# Patient Record
Sex: Female | Born: 1965
Health system: Southern US, Community
[De-identification: ages and names within clinical notes are randomized; demographics above are authoritative.]

## PROBLEM LIST (undated history)

## (undated) DIAGNOSIS — E079 Disorder of thyroid, unspecified: Secondary | ICD-10-CM

## (undated) HISTORY — DX: Disorder of thyroid, unspecified: E07.9

## (undated) HISTORY — PX: BREAST EXCISIONAL BIOPSY: SUR124

## (undated) HISTORY — PX: TONSILLECTOMY: SUR1361

---

## 1999-04-06 ENCOUNTER — Encounter: Payer: Self-pay | Admitting: Cardiovascular Disease

## 1999-04-06 ENCOUNTER — Encounter: Payer: Self-pay | Admitting: Emergency Medicine

## 1999-04-06 ENCOUNTER — Inpatient Hospital Stay (HOSPITAL_COMMUNITY): Admission: EM | Admit: 1999-04-06 | Discharge: 1999-04-07 | Payer: Self-pay | Admitting: Emergency Medicine

## 1999-04-26 ENCOUNTER — Ambulatory Visit (HOSPITAL_COMMUNITY): Admission: RE | Admit: 1999-04-26 | Discharge: 1999-04-26 | Payer: Self-pay | Admitting: Gastroenterology

## 2000-01-14 ENCOUNTER — Other Ambulatory Visit: Admission: RE | Admit: 2000-01-14 | Discharge: 2000-01-14 | Payer: Self-pay | Admitting: Obstetrics & Gynecology

## 2000-02-21 ENCOUNTER — Encounter: Admission: RE | Admit: 2000-02-21 | Discharge: 2000-05-21 | Payer: Self-pay | Admitting: Obstetrics & Gynecology

## 2000-07-23 ENCOUNTER — Encounter (INDEPENDENT_AMBULATORY_CARE_PROVIDER_SITE_OTHER): Payer: Self-pay | Admitting: Specialist

## 2000-07-23 ENCOUNTER — Inpatient Hospital Stay (HOSPITAL_COMMUNITY): Admission: AD | Admit: 2000-07-23 | Discharge: 2000-07-25 | Payer: Self-pay | Admitting: Obstetrics & Gynecology

## 2000-07-29 ENCOUNTER — Inpatient Hospital Stay (HOSPITAL_COMMUNITY): Admission: AD | Admit: 2000-07-29 | Discharge: 2000-07-29 | Payer: Self-pay | Admitting: Obstetrics and Gynecology

## 2001-08-03 ENCOUNTER — Other Ambulatory Visit: Admission: RE | Admit: 2001-08-03 | Discharge: 2001-08-03 | Payer: Self-pay | Admitting: Obstetrics & Gynecology

## 2003-01-27 ENCOUNTER — Other Ambulatory Visit: Admission: RE | Admit: 2003-01-27 | Discharge: 2003-01-27 | Payer: Self-pay | Admitting: Obstetrics & Gynecology

## 2003-06-17 ENCOUNTER — Emergency Department (HOSPITAL_COMMUNITY): Admission: EM | Admit: 2003-06-17 | Discharge: 2003-06-17 | Payer: Self-pay | Admitting: Emergency Medicine

## 2004-06-28 ENCOUNTER — Other Ambulatory Visit: Admission: RE | Admit: 2004-06-28 | Discharge: 2004-06-28 | Payer: Self-pay | Admitting: Obstetrics & Gynecology

## 2005-10-17 ENCOUNTER — Other Ambulatory Visit: Admission: RE | Admit: 2005-10-17 | Discharge: 2005-10-17 | Payer: Self-pay | Admitting: Obstetrics & Gynecology

## 2005-10-28 ENCOUNTER — Emergency Department (HOSPITAL_COMMUNITY): Admission: EM | Admit: 2005-10-28 | Discharge: 2005-10-28 | Payer: Self-pay | Admitting: Emergency Medicine

## 2005-10-28 ENCOUNTER — Ambulatory Visit: Payer: Self-pay | Admitting: Internal Medicine

## 2005-11-19 ENCOUNTER — Encounter: Admission: RE | Admit: 2005-11-19 | Discharge: 2005-11-19 | Payer: Self-pay | Admitting: Obstetrics & Gynecology

## 2006-11-06 ENCOUNTER — Encounter: Admission: RE | Admit: 2006-11-06 | Discharge: 2006-11-06 | Payer: Self-pay | Admitting: Obstetrics & Gynecology

## 2008-01-28 ENCOUNTER — Ambulatory Visit: Payer: Self-pay | Admitting: Cardiology

## 2008-11-04 ENCOUNTER — Ambulatory Visit: Payer: Self-pay | Admitting: Gastroenterology

## 2008-11-04 DIAGNOSIS — K625 Hemorrhage of anus and rectum: Secondary | ICD-10-CM | POA: Insufficient documentation

## 2008-11-14 ENCOUNTER — Ambulatory Visit: Payer: Self-pay | Admitting: Gastroenterology

## 2009-12-07 ENCOUNTER — Encounter: Admission: RE | Admit: 2009-12-07 | Discharge: 2009-12-07 | Payer: Self-pay | Admitting: Obstetrics & Gynecology

## 2009-12-18 ENCOUNTER — Encounter: Admission: RE | Admit: 2009-12-18 | Discharge: 2009-12-18 | Payer: Self-pay | Admitting: Family Medicine

## 2010-09-30 ENCOUNTER — Encounter: Payer: Self-pay | Admitting: Family Medicine

## 2011-01-06 IMAGING — CT CT ABD-PELV W/ CM
2 of 5 series · 17 of 46 positions shown, 19 images · IV contrast (CONTRAST)
Comparison: None

CLINICAL DATA: Pelvic pain and nausea.

CT ABDOMEN AND PELVIS WITH CONTRAST
TECHNIQUE: Multidetector CT imaging of the abdomen and pelvis was
performed following the standard protocol during bolus
administration of intravenous contrast.
Contrast: 100 ml [id number]

[Series 2: portal · axial · portal-venous · 0.68mm/px · z∈[+536,+911]mm · 14 of 85 slices shown, 16 images]
[im 5/85  soft-tissue]
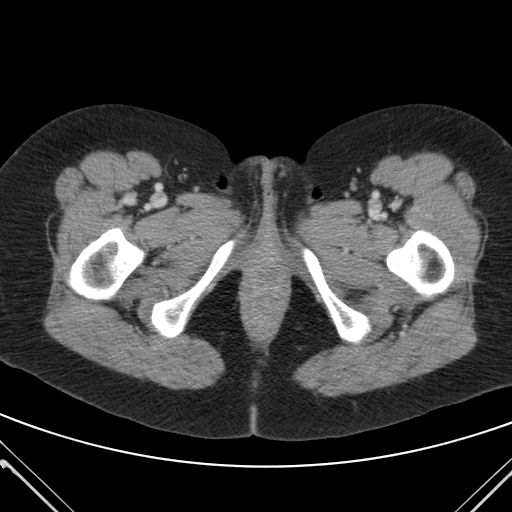
[im 5/85  bone]
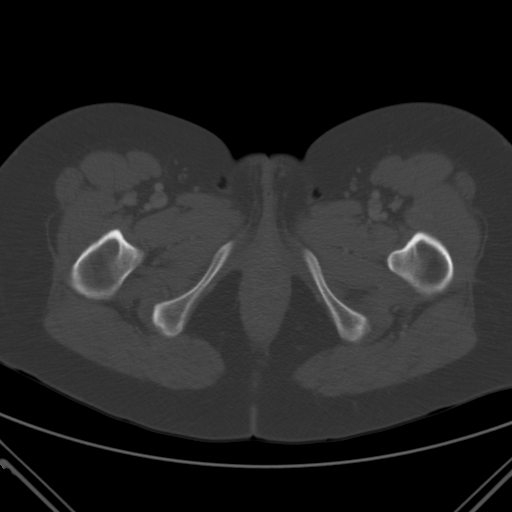
[im 10/85  soft-tissue]
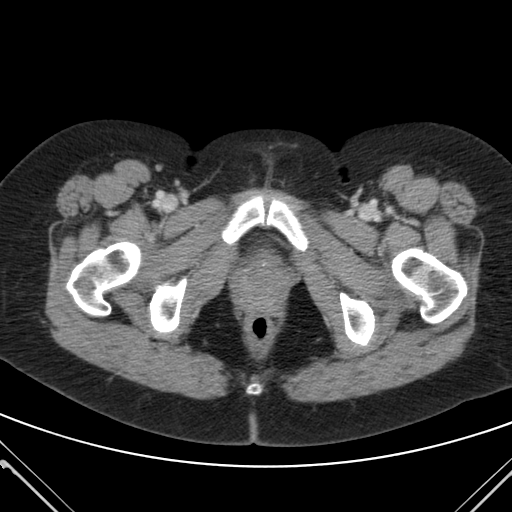
[im 19/85  soft-tissue]
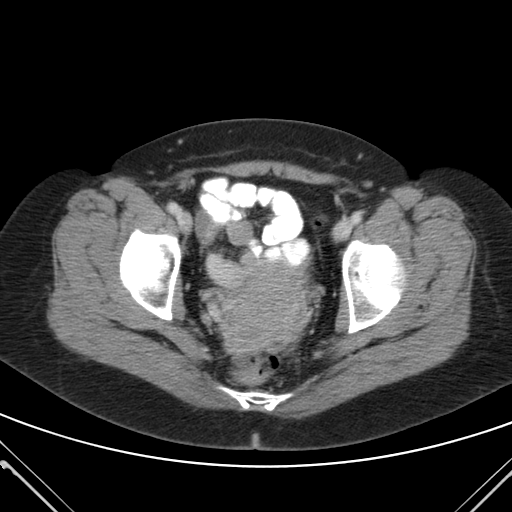
[im 24/85  soft-tissue]
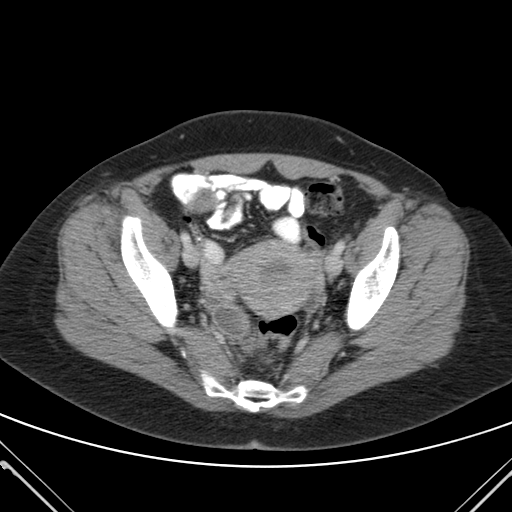
[im 29/85  soft-tissue]
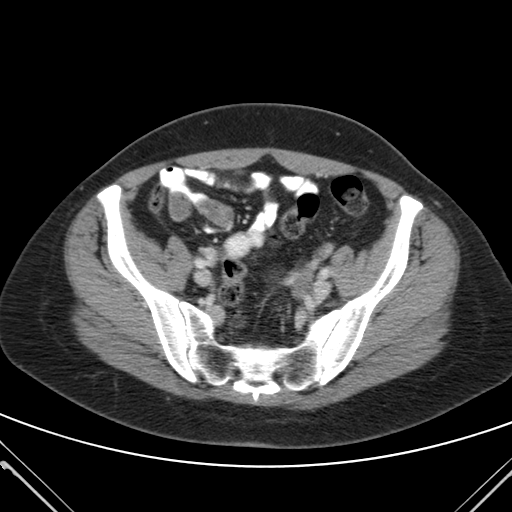
[im 33/85  soft-tissue]
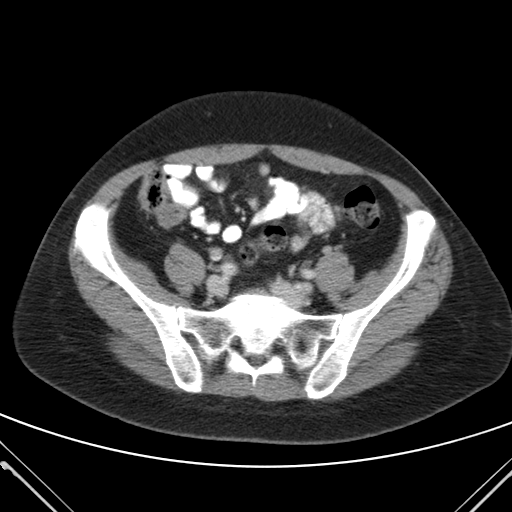
[im 38/85  soft-tissue]
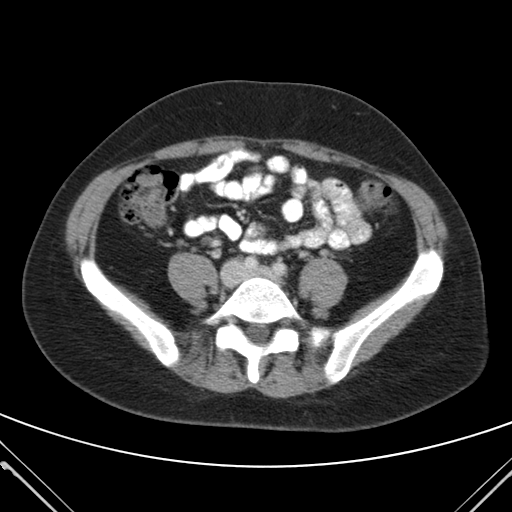
[im 47/85  soft-tissue]
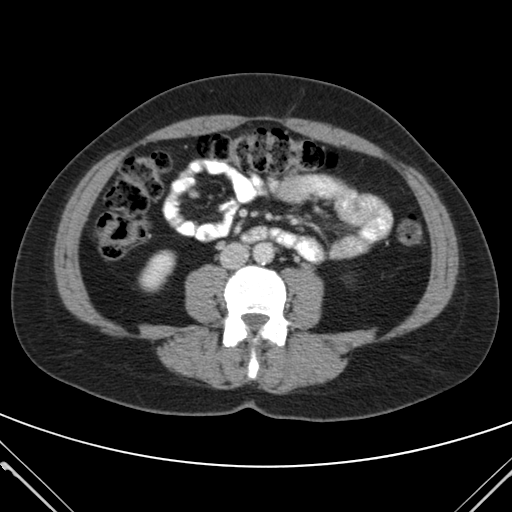
[im 52/85  soft-tissue]
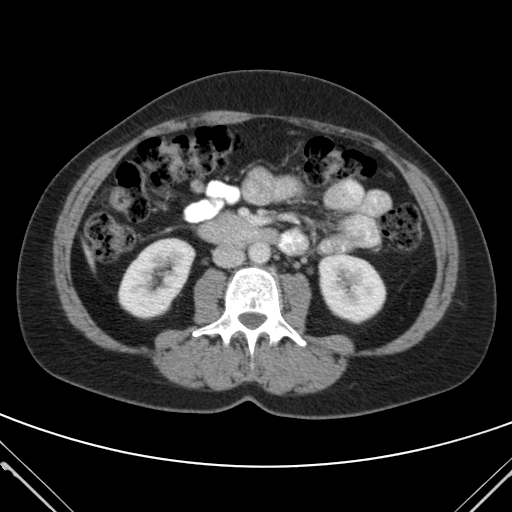
[im 52/85  bone]
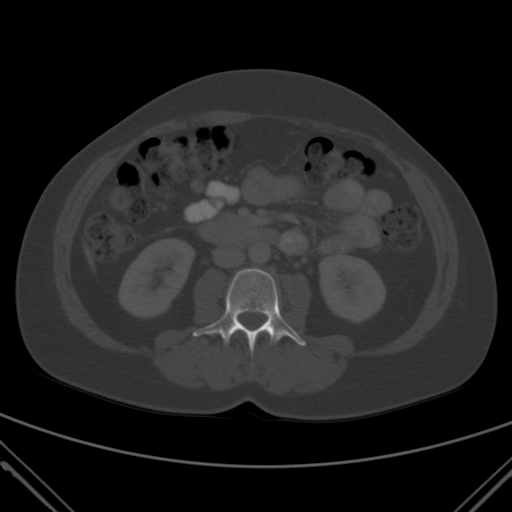
[im 57/85  soft-tissue]
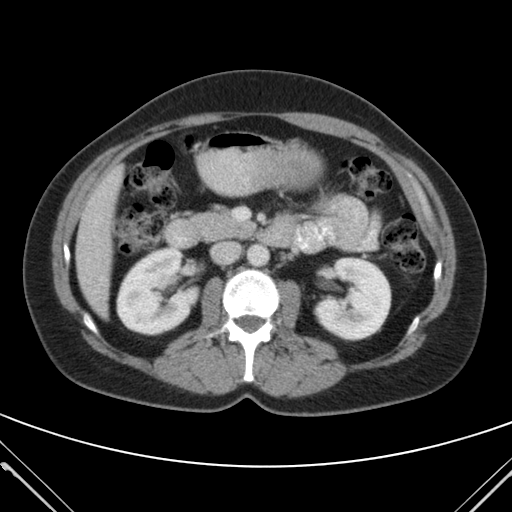
[im 61/85  soft-tissue]
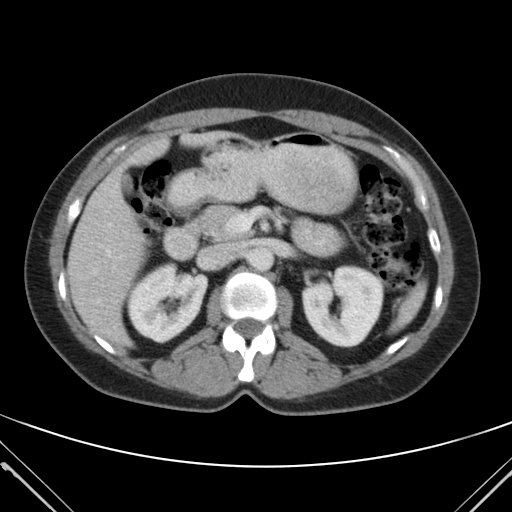
[im 66/85  soft-tissue]
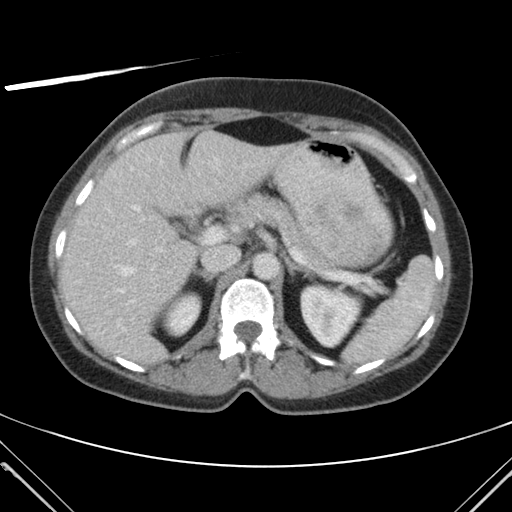
[im 75/85  soft-tissue]
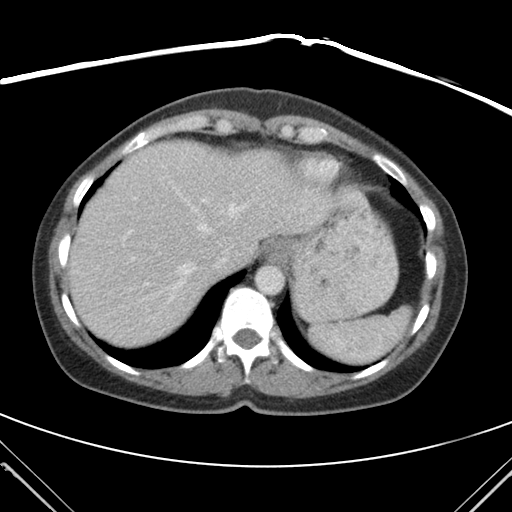
[im 80/85  soft-tissue]
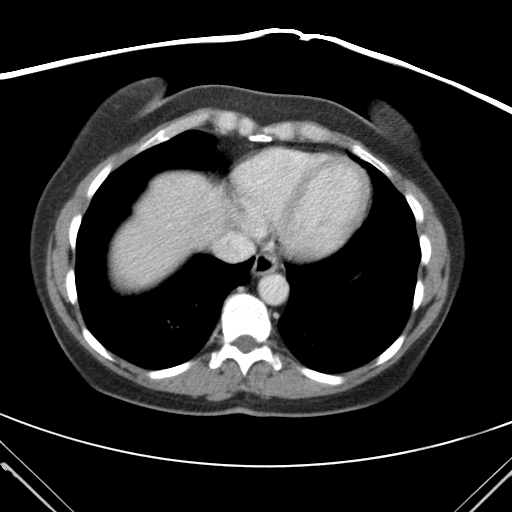

[cor · coronal · 0.82mm/px · 3 of 75 slices shown]
[im 25/75  soft-tissue]
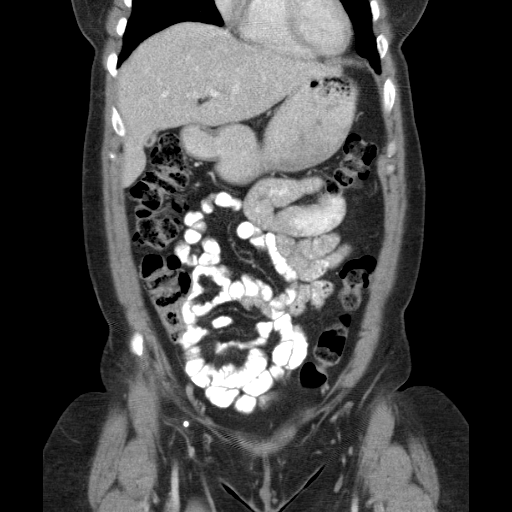
[im 33/75  soft-tissue]
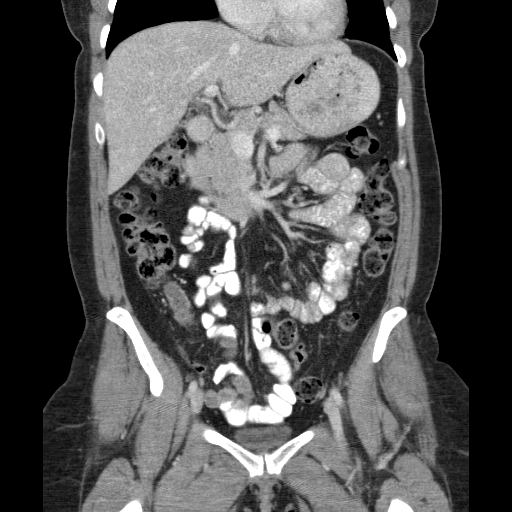
[im 42/75  soft-tissue]
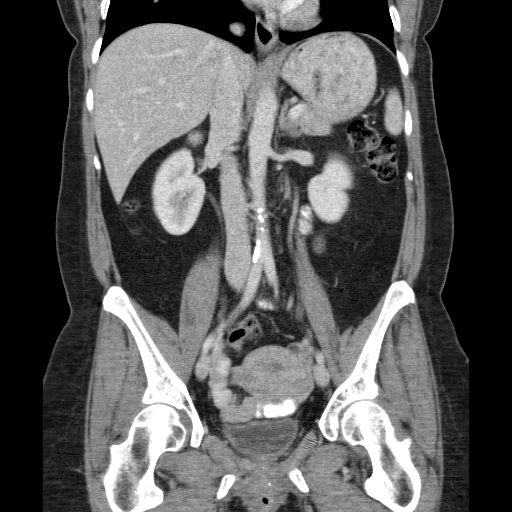

[17 of 46 positions shown; findings below may reference images not displayed]

FINDINGS: Lung bases are clear.  No pleural or pericardial fluid.
The liver has a normal appearance without focal lesions or biliary
ductal dilatation.  No calcified gallstones.  The spleen is normal.
The pancreas is normal.  The adrenal glands are normal.  The
kidneys are normal.  The aorta shows minimal atherosclerosis but no
aneurysm.  The IVC is normal.  No retroperitoneal mass or
adenopathy.  No bowel pathology seen within the abdominal portion
of the scan.  No free fluid or air in that region.

In the pelvis, the appendix is well seen and appears normal.  The
uterus appears normal.  The left adnexal region appears normal.  In
the right adnexal region, there are some areas of rounded fluid
density material.  These could possibly represent ovarian cysts.
The differential diagnosis does include tubo-ovarian abscess,
hydrosalpinx and endometriomas.
IMPRESSION: No definitely pathologic finding.  Areas of low density on the
order of 2 cm in size in the right adnexal region that could
represent  functional ovarian cysts.  However, the differential
diagnosis does include tubo-ovarian abscess, hydrosalpinx and
endometrioma.

## 2011-01-22 NOTE — Assessment & Plan Note (Signed)
[facility]                            CARDIOLOGY OFFICE NOTE   Leah Torres, Leah Torres                       MRN:          [id number]  DATE:01/28/2008                            DOB:          08-11-1966    REASON FOR PRESENTATION:  Evaluate the patient with palpitations.   HISTORY OF PRESENT ILLNESS:  The patient is a pleasant 45 year old with  past history of chest discomfort.  She had a cardiac catheterization in  2007 that demonstrated an EF of 65% and no coronary disease.  She had  had chest pain some years before that and had a stress test, which was  normal.  She has not had any chest discomfort since that time.  She did  have palpitations.  This was early April.  She was just developing Strep  at that time.  She felt her heart skipping and flipping and flopping.  It would seem to stop and then start back again.  It was happening  several times in 1 day.  It persisted for few days.  She had another  episode, less severe some days after that.  However, in the last few  weeks, she has had none of this.  She has had no presyncope or syncope.  She cannot bring these episodes on.  She now feels well.  She has not  been having any chest discomfort.  She has been having no shortness of  breath, PND, or orthopnea.  She is active, doing her activities of daily  living, and occasionally walking for exercise, but does not have a  regular exercise regimen.   Of note, I did review her labs with her that demonstrate she has an LDL  of 148 and an HDL of 62.   PAST MEDICAL HISTORY:  1. Hypothyroidism.  2. Dyslipidemia as described.   PAST SURGICAL HISTORY:  1. Tubal ligation.  2. Tonsillectomy.   ALLERGIES:  None.   MEDICATIONS:  1. Synthroid 150 mcg daily.  2. Advil.   SOCIAL HISTORY:  The patient is a Architectural technologist.  She is married  and has 4 children.  She quit smoking in 1992.  She does not drink  alcohol.   FAMILY HISTORY:  Contributory  for father having longstanding vascular  disease and dying at the age 16 of stomach cancer.   REVIEW OF SYSTEMS:  As stated in the HPI and negative for all other  systems.   PHYSICAL EXAMINATION:  The patient is well appearing and in no distress.  Blood pressure 121/85, heart rate 81 and regular.  HEENT:  Eyes unremarkable; pupils equal, round, and reactive to light;  fundi not visualized; oral mucosa moist.  NECK:  No jugular venous distention at 45 degrees, carotid upstroke  brisk and symmetric, no bruits, no thyromegaly.  LYMPHATICS:  No cervical, axillary, or inguinal adenopathy.  LUNGS:  Clear to auscultation bilaterally.  BACK:  No costovertebral angle tenderness.  CHEST:  Unremarkable.  HEART:  PMI not displaced or sustained, S1 and S2 within normal limits,  no S3, no S4, no clicks, no rubs, no murmurs.  ABDOMEN:  Flat, positive bowel sounds, normal in frequency and pitch, no  bruits, no rebound, no guarding, no midline pulsatile mass, no  hepatomegaly, no splenomegaly.  SKIN:  No rashes, no nodules.  EXTREMITIES:  2+ pulses throughout, no edema, no cyanosis or clubbing.  NEUROLOGICAL:  Oriented to person, place, and time.  Cranial nerves II  through XII are grossly intact.  Motor grossly intact.   EKG:  Sinus rhythm, rate 85, axis within normal limits, intervals within  normal limits, no acute ST-T wave changes.   ASSESSMENT AND PLAN:  1. Premature atrial contractions.  The patient had premature atrial      contractions on an EKG at Dr. Kathi Der office.  She has      palpitations, but these are no longer bothering her.  She had a      normal catheterization in 2007, and otherwise, normal physical      exam.  She has her thyroid followed regularly and did recently had      other labs to include normal electrolytes.  At this point, I would      not suggest further cardiovascular testing.  If she has more      symptomatic palpitations going forward, then I would promptly       empirically treat her with a beta-blocker or a calcium channel      blocker.  She will let me know if this occurs.  2. Dyslipidemia.  We spent a long time discussing this.  Given her      family history, I would have a low threshold for statins.  However,      she wants to avoid this and wants to very much try diet and      exercise.  We discussed goals of therapy.  She will follow up with      Dr. Christell Constant.  3. Weight.  The patient is slightly overweight.  She just bought the      [address], which is my recommended diet, and I encouraged      this with exercise.  4. Hypothyroidism.  She is on thyroid replacement and is due to have      this checked soon.  This is particularly important in light of her      palpitations.  5. Chest pain.  She is no longer having any chest discomfort, and no      further cardiovascular testing is suggested.  6. Follow up will be as needed.    Rollene Rotunda, MD, [facility]  Electronically Signed   JH/MedQ  DD: 01/28/2008  DT: 01/29/2008  Job #: [id number]   cc:   Ernestina Penna, M.D.

## 2011-01-25 NOTE — Discharge Summary (Signed)
[facility]  Patient:    Leah Torres, Leah Torres                       MRN: [id number] Adm. Date:  [id number] Disc. Date: [id number] Attending:  Marcelle Overlie Dictator:   Leilani Able, P.A.                           Discharge Summary  FINAL DIAGNOSES:              1. Vacuum assisted vaginal delivery of a female                                  infant with Apgars of 8 and 9.                               2. Desire postpartum sterilization.  PROCEDURE:                    Pomeroy tubal sterilization.  SURGEON:                      Gerrit Friends. Aldona Bar, M.D.  HISTORY OF PRESENT ILLNESS:   This 45 year old G4, P3 presents at [redacted] weeks gestation for induction secondary to concern of macrosomia.  The patients cervix was already 3 cm dilated, 50% effaced, at a -2 station.  HOSPITAL COURSE:              AROM was performed.  IUPCs were placed. The patient dilated to complete. She had a vacuum assisted vaginal delivery of a 7 pound 12 ounce female infant with Apgars of 8 and 9.  The delivery went without complications and the delivery was performed by Dr. Annamaria Helling. The patient also expressed her desires for a tubal sterilization procedure. She was taken to the operating room on July 23, 2000, by Dr. Aldona Bar as well, where a postpartum tubal ligation was performed using the Pomeroy method.  The procedure went without complications.  The patients postpartum course was benign without significant fevers.  She was thought ready for discharge on postpartum day #2.  DISCHARGE INSTRUCTIONS:       She was sent home on a regular diet, told to decrease activities.  She was given Tylox one to two every four hours as needed for pain.  She was told she could use over-the-counter pain medicines and was told to follow up in the office in four weeks. DD:  08/20/00 TD:  08/20/00 Job: [id number]

## 2011-01-25 NOTE — H&P (Signed)
NAME:  Leah Torres, Leah Torres NO.:  [id number]   MEDICAL RECORD NO.:  [id number]          PATIENT TYPE:  INP   LOCATION:  1825                         FACILITY:  [facility]   PHYSICIAN:  Pricilla Riffle, M.D.    DATE OF BIRTH:  06-Jul-1966   DATE OF ADMISSION:  10/28/2005  DATE OF DISCHARGE:                                HISTORY & PHYSICAL   PRIMARY CARDIOLOGIST:  Dr. Dietrich Pates (new)   REASON FOR ADMISSION:  Ms. Gerdts is a 45 year old female with no known  history of coronary artery disease, but with cardiac risk factors notable  for history of hypercholesterolemia (untreated), history of tobacco smoking,  and family history of coronary artery disease.   Patient presents to the emergency room with recurrent chest pain over these  past three days.  However, she reports having had chest pain previously in  September 2006 while vacationing in [place].  At that time her father  required hospitalization and she experienced an episode of chest pain while  walking in the hospital hallway.  She was tended to by a nurse, referred to  the emergency room, and subsequently discharged with plans to return for a  scheduled exercise stress test.  A stress test was done and she was able to  exercise for 10 minutes in standard Bruce protocol achieving 87% PMHR.  She  had no chest pain during the test and perfusion images revealed no evidence  of ischemia/infarction; EF 67% with no obvious WMAs.   Patient then reports having had one recurrent episode of chest pain after  they returned from [facility].  This was in the setting of trying to help  her father who was severely debilitated and helping him to ambulate.  She  experienced chest pain with associated lightheadedness, but did not seek any  medical attention.  The symptoms resolved spontaneously.   This past Saturday night while sitting and watching a movie with her husband  she had mid sternal chest tightness which radiated  down the left arm and was  associated with some right neck discomfort and into the jaw as well.  There  was no associated dyspnea, diaphoresis, or nausea/vomiting.  She did not  take anything for this nor leave her seated position.  The symptoms resolved  spontaneously after approximately four hours.  She then had some mild  recurrent episodes throughout the day yesterday as well as a few this  morning.  Again, however, none of these were associated with any strenuous  activity or exercise.  These episodes occur at rest, resolve after a few  minutes, and have been less intense and of shorter duration than the one she  experienced Saturday night.   Admission electrocardiogram shows normal sinus rhythm with no obvious  ischemic changes.   ALLERGIES:  No known drug allergies.   HOME MEDICATIONS:  None.   PAST MEDICAL HISTORY:  1.  History of hypercholesterolemia (reportedly approximately 260 with HDL      in the 60s).  2.  Status post tubal ligation.   SOCIAL HISTORY:  [place]  They have four children.  She is unemployed.  She has not smoked tobacco in  approximately 15 years, but has a prior approximate 8-pack-year history of  smoking.  Denies alcohol use.   FAMILY HISTORY:  Father deceased age 60, history of congestive heart failure  and prior MIs in his 3s, bilateral carotid endarterectomy and history of  AAA.  Mother age 16 with no known heart disease.  Patient also reports a  maternal uncle age 35 recently diagnosed with CAD/status post stenting.   REVIEW OF SYSTEMS:  As noted per HPI.  Denies exertional chest pain or any  recent exertional dyspnea, orthopnea, or paroxysmal nocturnal dyspnea.  Denies any symptoms of reflux disease or a history of peptic ulcer disease.  Denies any obvious bleeding.  Otherwise, as noted per HPI.  Remaining  systems negative.   PHYSICAL EXAMINATION:  VITAL SIGNS:  Blood pressure 132/83 with a  pulse of  92 and saturations 98% on room air.  GENERAL:  45 year old female in no apparent distress.  HEENT:  Normocephalic, atraumatic.  NECK:  Visible carotid pulses without bruits.  LUNGS:  Clear to auscultation all fields.  HEART:  Regular rate and rhythm (S1, S2).  No murmurs, rubs, or gallops.  ABDOMEN:  Soft, nontender with intact bowel sounds and no bruits.  EXTREMITIES:  Palpable bilateral femoral pulses without bruits.  Intact  distal pulses without significant edema.  NEUROLOGIC:  No focal deficit.   Admission chest x-ray:  Pending.  Admission electrocardiogram:  Normal sinus  rhythm at 94 BPM with normal axis, no ischemic changes.   LABORATORY DATA:  Hemoglobin 13.3, hematocrit 38.6, WBC 4.3, platelets 322.  Sodium 134, potassium 3.9, BUN 9, creatinine 0.6, glucose 93.  Total protein  6.3, albumin 3.  INR 0.9.  Liver enzymes normal.  Cardiac enzymes (POC):  MB  1.3, troponin I less than 0.05.   IMPRESSION:  1.  Recurrent angina pectoris.      1.  Non-exertional/atypical features.      2.  Normal adequate exercise stress Cardiolite; ejection fraction 67%          September 2006 [facility]).  2.  Multiple cardiac risk factors.      1.  History of hypercholesterolemia.      2.  Family history coronary artery disease.      3.  History of tobacco.  3.  Anxiety.   PLAN:  Although the patient presents with features which are atypical for  ischemic heart disease, she has had recurrent chest pain and does have  several cardiac risk factors.  She also had prior history of chest pain with  a normal exercise stress test in September 2006.  Therefore, recommendation  is to proceed with definitive exclusion of any significant coronary artery  disease with a diagnostic coronary angiogram.  The patient is agreeable with  this plan and is willing to proceed.  Risks/benefits have been discussed.  Patient has been treated with aspirin and we will give her a dose of  Lopressor 25  while in the emergency room.  We will also start her on a  proton-pump inhibitor.  Intravenous heparin will be deferred given that she  is currently asymptomatic, has initial cardiac enzymes which are negative,  and a benign electrocardiogram.   In addition to cycling cardiac markers we will also check a fasting lipid  profile in the morning as well as a TSH level.      Gene  Serpe, P.A. [facility]    ______________________________  Pricilla Riffle, M.D.    GS/MEDQ  D:  10/28/2005  T:  10/28/2005  Job:  [id number]   cc:   Chari Manning, M.D.

## 2011-01-25 NOTE — Op Note (Signed)
[facility]  Patient:    Leah Torres, Leah Torres                       MRN: [id number] Proc. Date: 07/23/00 Adm. Date:  [id number] Attending:  Mickle Mallory                           Operative Report  PREOPERATIVE DIAGNOSIS:       Postpartum desire for permanent elective                               sterilization.  POSTOPERATIVE DIAGNOSIS:      Postpartum desire for permanent elective                               sterilization.  PATHOLOGY:                    Pending on segment of each fallopian tube.  OPERATION:                    Pomeroy tubal sterilization, postpartum.  SURGEON:                      Gerrit Friends. Aldona Bar, M.D.  ANESTHESIA:                   Epidural.  INDICATIONS:                  This 45 year old gravida 3, now para 4, delivered earlier today and is desirous of a permanent elective sterilization procedure.  She is aware that such procedure is meant to be 100% permanent, but unfortunately is not 100% perfect - subsequent pregnancy can occur. According to her wishes, she is now being taken to the operating room for a sterilization procedure utilizing the epidural that she had placed during her labor.  DESCRIPTION OF PROCEDURE:     The patient was taken to the operating room where her epidural was augmented and after good anesthetic levels were documented, she was prepped and draped in the usual fashion having been placed in the supine position.  The uterus was at the umbilicus.  With minimal difficulty, a 2 cm subumbilical transverse skin incision was made and dissected down to and through the fascia and peritoneum with ease.  The fundus of the uterus felt and looked normal.  With minimal difficulty, the right fallopian tube was identified, traced out to the fimbriated end for positive identification, and then in the mid portion of the right fallopian tube a knuckle was elevated and a free tie of #1 plain catgut suture was tied  about the knuckle and the knuckle was removed and sent to pathology.  Hemostasis was adequate.  A similar procedure was carried out on the left fallopian tube.  At this time, with good  hemostasis and a segment of each fallopian tube resected, closure of the abdomen was begun after all counts were known to be correct and no foreign bodies were noted to be remaining in the abdominal cavity.  The abdominal peritoneum was closed with 0 Vicryl in a running fashion and fascia was closed with 0 Vicryl in an interrupted fashion. Hemostasis was adequate and the skin and subcu tissue were closed with running subcuticular sutures of 3-0 Vicryl.  A Band-Aid was applied and the patient at this time was transported to the recovery room in satisfactory condition after tolerating the procedure well.  Estimated blood loss was negligible.  All counts were correct x 2.  Pathologic specimen consisted of a segment of each fallopian tube.  The patient will be transferred to her routine postpartum room after leaving the recovery room and will resume routine postpartum orders with her epidural and IV eventually removed. DD:  07/23/00 TD:  07/23/00 Job: [id number]

## 2011-01-25 NOTE — Cardiovascular Report (Signed)
NAMESHEMECA, LUKASIK NO.:  [id number]   MEDICAL RECORD NO.:  [id number]          PATIENT TYPE:  INP   LOCATION:  1825                         FACILITY:  [facility]   PHYSICIAN:  Salvadore Farber, M.D. LHCDATE OF BIRTH:  Mar 14, 1966   DATE OF PROCEDURE:  10/28/2005  DATE OF DISCHARGE:  10/28/2005                              CARDIAC CATHETERIZATION   PROCEDURE:  Left heart catheterization, left ventriculography, coronary  angiography, Starclose closure of the right common femoral arteriotomy site.   INDICATIONS:  Ms. Desouza is a 45 year old woman with family history of  premature atherosclerotic disease, her father having developed coronary  disease in his 12s. She has had episodes of substernal chest discomfort  occurring primarily at rest for several months. She had an exercise  tolerance test performed in the fall which demonstrated no evidence of  ischemia per report. Over the past couple of weeks, she has had multiple  episodes including a prolonged one two days prior. She spoke with Dr.  Rennie Plowman today who recommended that she proceed to hospital for urgent  evaluation. Here, she is painfree with a normal electrocardiogram. After  discussion of testing modalities for exclusion of coronary disease as an  etiology of her chest discomfort, the patient and Dr. Tenny Craw arrived at the  decision to proceed to cardiac catheterization for definitive exclusion.   PROCEDURAL TECHNIQUE:  Informed consent was obtained. Under 1% lidocaine  local anesthesia, a 5-French sheath was placed in the right common femoral  artery using the modified Seldinger technique. Diagnostic angiography and  ventriculography were performed using JL-4, JR-4 and pigtail catheters. The  arteriotomy was then closed using a Starclose device. Complete hemostasis  was obtained. The patient was then transferred to the holding room in stable  condition having tolerated the procedure well.   COMPLICATIONS:  None.   FINDINGS:  1.  LV: 120/2/10. EF 65% without regional wall motion abnormality.  2.  No aortic stenosis or mitral regurgitation.  3.  Left main: Angiographically normal.  4.  LAD: Moderate-sized vessel giving rise to two diagonal branches. The      first is moderate-sized and the second is small. It is angiographically      normal.  5.  Circumflex: Moderate-sized vessel giving rise to a single large      branching obtuse marginal. It is angiographically normal.  6.  RCA: Moderate-sized dominant vessel with a downward takeoff. It is      angiographically normal.   IMPRESSION/RECOMMENDATIONS:  The patient has angiographically normal  coronary arteries with normal left ventricular size and systolic function.  There is no aortic stenosis or mitral regurgitation. Based on these  findings, I suspect a noncardiac etiology to her chest discomfort. I have  asked her to follow up with her primary care physician for further  evaluation.      Salvadore Farber, M.D. [facility]  Electronically Signed     WED/MEDQ  D:  10/28/2005  T:  10/28/2005  Job:  [id number]   cc:   Pricilla Riffle, M.D.  [address]  [address]  [address]  [place] [address]   [place], M.D.  [place], [place]

## 2012-07-13 ENCOUNTER — Other Ambulatory Visit: Payer: Self-pay | Admitting: Obstetrics & Gynecology

## 2012-07-13 DIAGNOSIS — N63 Unspecified lump in unspecified breast: Secondary | ICD-10-CM

## 2012-07-17 ENCOUNTER — Ambulatory Visit
Admission: RE | Admit: 2012-07-17 | Discharge: 2012-07-17 | Disposition: A | Discharge status: Home / Self Care | Payer: BC Managed Care – PPO | Source: Ambulatory Visit | Attending: Obstetrics & Gynecology | Admitting: Obstetrics & Gynecology

## 2012-07-17 DIAGNOSIS — N63 Unspecified lump in unspecified breast: Secondary | ICD-10-CM

## 2013-08-09 ENCOUNTER — Encounter: Payer: Self-pay | Admitting: Nurse Practitioner

## 2013-08-09 ENCOUNTER — Ambulatory Visit (INDEPENDENT_AMBULATORY_CARE_PROVIDER_SITE_OTHER): Payer: BC Managed Care – PPO | Admitting: Nurse Practitioner

## 2013-08-09 ENCOUNTER — Encounter (INDEPENDENT_AMBULATORY_CARE_PROVIDER_SITE_OTHER): Payer: Self-pay

## 2013-08-09 VITALS — BP 124/81 | HR 93 | Temp 97.5°F | Ht 62.0 in | Wt 148.0 lb

## 2013-08-09 DIAGNOSIS — F411 Generalized anxiety disorder: Secondary | ICD-10-CM | POA: Insufficient documentation

## 2013-08-09 DIAGNOSIS — G4700 Insomnia, unspecified: Secondary | ICD-10-CM

## 2013-08-09 MED ORDER — ESCITALOPRAM OXALATE 10 MG PO TABS: 10.0000 mg | ORAL_TABLET | Freq: Every day | ORAL | Status: DC

## 2013-08-09 MED ORDER — ZOLPIDEM TARTRATE 10 MG PO TABS: 10.0000 mg | ORAL_TABLET | Freq: Every evening | ORAL | Status: DC | PRN

## 2013-08-09 NOTE — Progress Notes (Signed)
   Subjective:    Patient ID: Leah Torres, female    DOB: 07/10/1966, 47 y.o.   MRN: [id number]  HPI Patient in today c/o trouble sleeping. 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  * patient says that she has become very anxious and would like to try something for anxiety- her new job is stressing her out.   Review of Systems  Respiratory: Negative.   Cardiovascular: Negative.   Gastrointestinal: Negative.   Psychiatric/Behavioral: Positive for sleep disturbance and decreased concentration. Negative for suicidal ideas, behavioral problems, confusion and agitation. The patient is nervous/anxious.   All other systems reviewed and are negative.       Objective:   Physical Exam  Constitutional: She is oriented to person, place, and time. She appears well-developed and well-nourished.  Cardiovascular: Normal rate, regular rhythm and normal heart sounds.   Pulmonary/Chest: Effort normal and breath sounds normal.  Neurological: She is alert and oriented to person, place, and time.  Skin: Skin is warm.  Psychiatric: She has a normal mood and affect. Her behavior is normal. Judgment and thought content normal.    BP 124/81  Pulse 93  Temp(Src) 97.5 F (36.4 C) (Oral)  Ht 5\' 2"  (1.575 m)  Wt 148 lb (67.132 kg)  BMI 27.06 kg/m2       Assessment & Plan:  1. GAD (generalized anxiety disorder) Stress management - escitalopram (LEXAPRO) 10 MG tablet; Take 1 tablet (10 mg total) by mouth daily.  Dispense: 30 tablet; Refill: 3  2. Insomnia Bedtime ritual Do not eat heavy meal 2 hours prior to bedtime - zolpidem (AMBIEN) 10 MG tablet; Take 1 tablet (10 mg total) by mouth at bedtime as needed for sleep.  Dispense: 30 tablet; Refill: 1   Mary-Margaret Daphine Deutscher, FNP

## 2013-08-09 NOTE — Patient Instructions (Signed)
Stress Management Stress is a state of physical or mental tension that often results from changes in your life or normal routine. Some common causes of stress are:  Death of a loved one.  Injuries or severe illnesses.  Getting fired or changing jobs.  Moving into a new home. Other causes may be:  Sexual problems.  Business or financial losses.  Taking on a large debt.  Regular conflict with someone at home or at work.  Constant tiredness from lack of sleep. It is not just bad things that are stressful. It may be stressful to:  Win the lottery.  Get married.  Buy a new car. The amount of stress that can be easily tolerated varies from person to person. Changes generally cause stress, regardless of the types of change. Too much stress can affect your health. It may lead to physical or emotional problems. Too little stress (boredom) may also become stressful. SUGGESTIONS TO REDUCE STRESS:  Talk things over with your family and friends. It often is helpful to share your concerns and worries. If you feel your problem is serious, you may want to get help from a professional counselor.  Consider your problems one at a time instead of lumping them all together. Trying to take care of everything at once may seem impossible. List all the things you need to do and then start with the most important one. Set a goal to accomplish 2 or 3 things each day. If you expect to do too many in a single day you will naturally fail, causing you to feel even more stressed.  Do not use alcohol or drugs to relieve stress. Although you may feel better for a short time, they do not remove the problems that caused the stress. They can also be habit forming.  Exercise regularly - at least 3 times per week. Physical exercise can help to relieve that "uptight" feeling and will relax you.  The shortest distance between despair and hope is often a good night's sleep.  Go to bed and get up on time allowing  yourself time for appointments without being rushed.  Take a short "time-out" period from any stressful situation that occurs during the day. Close your eyes and take some deep breaths. Starting with the muscles in your face, tense them, hold it for a few seconds, then relax. Repeat this with the muscles in your neck, shoulders, hand, stomach, back and legs.  Take good care of yourself. Eat a balanced diet and get plenty of rest.  Schedule time for having fun. Take a break from your daily routine to relax. HOME CARE INSTRUCTIONS   Call if you feel overwhelmed by your problems and feel you can no longer manage them on your own.  Return immediately if you feel like hurting yourself or someone else. Document Released: 02/19/2001 Document Revised: 11/18/2011 Document Reviewed: 04/20/2013 Exit[employer] Patient Information 2014 [employer].  

## 2013-08-17 ENCOUNTER — Encounter: Payer: Self-pay | Admitting: Nurse Practitioner

## 2013-08-17 ENCOUNTER — Ambulatory Visit (INDEPENDENT_AMBULATORY_CARE_PROVIDER_SITE_OTHER): Payer: BC Managed Care – PPO | Admitting: Nurse Practitioner

## 2013-08-17 ENCOUNTER — Telehealth: Payer: Self-pay | Admitting: Nurse Practitioner

## 2013-08-17 VITALS — BP 119/76 | HR 74 | Temp 97.6°F | Ht 62.0 in | Wt 144.5 lb

## 2013-08-17 DIAGNOSIS — J019 Acute sinusitis, unspecified: Secondary | ICD-10-CM

## 2013-08-17 MED ORDER — AZITHROMYCIN 250 MG PO TABS: ORAL_TABLET | ORAL | Status: DC

## 2013-08-17 NOTE — Telephone Encounter (Signed)
Appt given for today with MMM

## 2013-08-17 NOTE — Progress Notes (Signed)
   Subjective:    Patient ID: Leah Torres, female    DOB: [birth year], 47 y.o.   MRN: [id number]  HPI Patient in c/o congestion and headache- started last Thursday- OTC benadryl- no better- fatigue.    Review of Systems  Constitutional: Positive for appetite change (decreased) and fatigue. Negative for fever and chills.  HENT: Positive for congestion, postnasal drip, rhinorrhea and sinus pressure. Negative for ear pain.   Respiratory: Negative for cough, choking and shortness of breath.   Cardiovascular: Negative.   Gastrointestinal: Negative.        Objective:   Physical Exam  Constitutional: She appears well-developed and well-nourished.  HENT:  Right Ear: Hearing, tympanic membrane, external ear and ear canal normal.  Left Ear: Hearing, tympanic membrane, external ear and ear canal normal.  Nose: Right sinus exhibits maxillary sinus tenderness and frontal sinus tenderness. Left sinus exhibits maxillary sinus tenderness and frontal sinus tenderness.  Mouth/Throat: Uvula is midline, oropharynx is clear and moist and mucous membranes are normal.  Eyes: EOM are normal. Pupils are equal, round, and reactive to light.  Neck: Normal range of motion. Neck supple.  Cardiovascular: Normal rate, regular rhythm and normal heart sounds.   Pulmonary/Chest: Effort normal and breath sounds normal.  Lymphadenopathy:    She has no cervical adenopathy.    BP 119/76  Pulse 74  Temp(Src) 97.6 F (36.4 C) (Oral)  Ht 5\' 2"  (1.575 m)  Wt 144 lb 8 oz (65.545 kg)  BMI 26.42 kg/m2  LMP 07/27/2013       Assessment & Plan:   1. Acute rhinosinusitis    Meds ordered this encounter  Medications  . azithromycin (ZITHROMAX Z-PAK) 250 MG tablet    Sig: As directed    Dispense:  6 each    Refill:  0    Order Specific Question:  Supervising Provider    Answer:  Ernestina Penna [[id number]]   1. Take meds as prescribed 2. Use a cool mist humidifier especially during the winter months and when heat  has  been humid. 3. Use saline nose sprays frequently 4. Saline irrigations of the nose can be very helpful if done frequently.  * 4X daily for 1 week*  * Use of a nettie pot can be helpful with this. Follow directions with this* 5. Drink plenty of fluids 6. Keep thermostat turn down low 7.For any cough or congestion  Use plain Mucinex- regular strength or max strength is fine   * Children- consult with Pharmacist for dosing 8. For fever or aces or pains- take tylenol or ibuprofen appropriate for age and weight.  * for fevers greater than 101 orally you may alternate ibuprofen and tylenol every  3 hours.   Mary-Margaret Daphine Deutscher, FNP

## 2013-08-17 NOTE — Patient Instructions (Signed)

## 2013-09-06 ENCOUNTER — Telehealth: Payer: Self-pay | Admitting: Nurse Practitioner

## 2013-09-06 NOTE — Telephone Encounter (Signed)
appt tomorrow with bill 

## 2013-09-07 ENCOUNTER — Ambulatory Visit (INDEPENDENT_AMBULATORY_CARE_PROVIDER_SITE_OTHER): Payer: BC Managed Care – PPO | Admitting: Family Medicine

## 2013-09-07 ENCOUNTER — Encounter: Payer: Self-pay | Admitting: Family Medicine

## 2013-09-07 VITALS — BP 137/90 | HR 80 | Temp 99.3°F | Ht 62.0 in | Wt 145.0 lb

## 2013-09-07 DIAGNOSIS — J209 Acute bronchitis, unspecified: Secondary | ICD-10-CM

## 2013-09-07 MED ORDER — METHYLPREDNISOLONE ACETATE 80 MG/ML IJ SUSP
80.0000 mg | Freq: Once | INTRAMUSCULAR | Status: AC
  Administered 2013-09-07: 80 mg via INTRAMUSCULAR

## 2013-09-07 MED ORDER — AZITHROMYCIN 250 MG PO TABS: ORAL_TABLET | ORAL | Status: DC

## 2013-09-07 NOTE — Patient Instructions (Signed)
Acute Bronchitis Bronchitis is inflammation of the airways that extend from the windpipe into the lungs (bronchi). The inflammation often causes mucus to develop. This leads to a cough, which is the most common symptom of bronchitis.  In acute bronchitis, the condition usually develops suddenly and goes away over time, usually in a couple weeks. Smoking, allergies, and asthma can make bronchitis worse. Repeated episodes of bronchitis may cause further lung problems.  CAUSES Acute bronchitis is most often caused by the same virus that causes a cold. The virus can spread from person to person (contagious).  SIGNS AND SYMPTOMS   Cough.   Fever.   Coughing up mucus.   Body aches.   Chest congestion.   Chills.   Shortness of breath.   Sore throat.  DIAGNOSIS  Acute bronchitis is usually diagnosed through a physical exam. Tests, such as chest X-rays, are sometimes done to rule out other conditions.  TREATMENT  Acute bronchitis usually goes away in a couple weeks. Often times, no medical treatment is necessary. Medicines are sometimes given for relief of fever or cough. Antibiotics are usually not needed but may be prescribed in certain situations. In some cases, an inhaler may be recommended to help reduce shortness of breath and control the cough. A cool mist vaporizer may also be used to help thin bronchial secretions and make it easier to clear the chest.  HOME CARE INSTRUCTIONS  Get plenty of rest.   Drink enough fluids to keep your urine clear or pale yellow (unless you have a medical condition that requires fluid restriction). Increasing fluids may help thin your secretions and will prevent dehydration.   Only take over-the-counter or prescription medicines as directed by your health care provider.   Avoid smoking and secondhand smoke. Exposure to cigarette smoke or irritating chemicals will make bronchitis worse. If you are a smoker, consider using nicotine gum or skin  patches to help control withdrawal symptoms. Quitting smoking will help your lungs heal faster.   Reduce the chances of another bout of acute bronchitis by washing your hands frequently, avoiding people with cold symptoms, and trying not to touch your hands to your mouth, nose, or eyes.   Follow up with your health care provider as directed.  SEEK MEDICAL CARE IF: Your symptoms do not improve after 1 week of treatment.  SEEK IMMEDIATE MEDICAL CARE IF:  You develop an increased fever or chills.   You have chest pain.   You have severe shortness of breath.  You have bloody sputum.   You develop dehydration.  You develop fainting.  You develop repeated vomiting.  You develop a severe headache. MAKE SURE YOU:   Understand these instructions.  Will watch your condition.  Will get help right away if you are not doing well or get worse. Document Released: 10/03/2004 Document Revised: 04/28/2013 Document Reviewed: 02/16/2013 ExitCare Patient Information 2014 [employer].  

## 2013-09-07 NOTE — Progress Notes (Signed)
   Subjective:    Patient ID: Leah Torres, female    DOB: 1966-07-16, 47 y.o.   MRN: [id number]  HPI This 47 y.o. female presents for evaluation of cough, sore throat, and uri sx's.   Review of Systems No chest pain, SOB, HA, dizziness, vision change, N/V, diarrhea, constipation, dysuria, urinary urgency or frequency, myalgias, arthralgias or rash.     Objective:   Physical Exam  Vital signs noted  Well developed well nourished female.  HEENT - Head atraumatic Normocephalic                Eyes - PERRLA, Conjuctiva - clear Sclera- Clear EOMI                Ears - EAC's Wnl TM's Wnl Gross Hearing WNL                Nose - Nares patent                 Throat - oropharanx wnl Respiratory - Lungs CTA bilateral Cardiac - RRR S1 and S2 without murmur GI - Abdomen soft Nontender and bowel sounds active x 4 Extremities - No edema. Neuro - Grossly intact.      Assessment & Plan:  Acute bronchitis - Plan: azithromycin (ZITHROMAX) 250 MG tablet, methylPREDNISolone acetate (DEPO-MEDROL) injection 80 mg Push po fluids, rest, tylenol and motrin otc prn as directed for fever, arthralgias, and myalgias.  Follow up prn if sx's continue or persist.

## 2014-02-16 ENCOUNTER — Other Ambulatory Visit: Payer: Self-pay | Admitting: Nurse Practitioner

## 2014-02-17 NOTE — Telephone Encounter (Signed)
Last seen 09/07/13  B [place] 

## 2014-03-22 ENCOUNTER — Emergency Department (HOSPITAL_COMMUNITY): Payer: BC Managed Care – PPO

## 2014-03-22 ENCOUNTER — Emergency Department (HOSPITAL_COMMUNITY)
Admission: EM | Admit: 2014-03-22 | Discharge: 2014-03-22 | Disposition: A | Discharge status: Home / Self Care | Payer: BC Managed Care – PPO | Attending: Emergency Medicine | Admitting: Emergency Medicine

## 2014-03-22 ENCOUNTER — Encounter (HOSPITAL_COMMUNITY): Payer: Self-pay | Admitting: Emergency Medicine

## 2014-03-22 DIAGNOSIS — E039 Hypothyroidism, unspecified: Secondary | ICD-10-CM | POA: Insufficient documentation

## 2014-03-22 DIAGNOSIS — W64XXXA Exposure to other animate mechanical forces, initial encounter: Secondary | ICD-10-CM | POA: Diagnosis not present

## 2014-03-22 DIAGNOSIS — S022XXA Fracture of nasal bones, initial encounter for closed fracture: Secondary | ICD-10-CM

## 2014-03-22 DIAGNOSIS — Z79899 Other long term (current) drug therapy: Secondary | ICD-10-CM | POA: Insufficient documentation

## 2014-03-22 DIAGNOSIS — Z87891 Personal history of nicotine dependence: Secondary | ICD-10-CM | POA: Diagnosis not present

## 2014-03-22 DIAGNOSIS — Y939 Activity, unspecified: Secondary | ICD-10-CM | POA: Insufficient documentation

## 2014-03-22 DIAGNOSIS — S0993XA Unspecified injury of face, initial encounter: Secondary | ICD-10-CM | POA: Diagnosis present

## 2014-03-22 DIAGNOSIS — S199XXA Unspecified injury of neck, initial encounter: Secondary | ICD-10-CM | POA: Diagnosis present

## 2014-03-22 DIAGNOSIS — Y929 Unspecified place or not applicable: Secondary | ICD-10-CM | POA: Insufficient documentation

## 2014-03-22 MED ORDER — IBUPROFEN 800 MG PO TABS
800.0000 mg | ORAL_TABLET | Freq: Once | ORAL | Status: AC
  Administered 2014-03-22: 800 mg via ORAL
  Filled 2014-03-22: qty 1

## 2014-03-22 MED ORDER — OXYCODONE-ACETAMINOPHEN 5-325 MG PO TABS
1.0000 | ORAL_TABLET | Freq: Once | ORAL | Status: DC
  Filled 2014-03-22: qty 1

## 2014-03-22 MED ORDER — ONDANSETRON HCL 4 MG PO TABS
4.0000 mg | ORAL_TABLET | Freq: Once | ORAL | Status: AC
  Administered 2014-03-22: 4 mg via ORAL
  Filled 2014-03-22: qty 1

## 2014-03-22 MED ORDER — IBUPROFEN 800 MG PO TABS: 800.0000 mg | ORAL_TABLET | Freq: Three times a day (TID) | ORAL | Status: DC

## 2014-03-22 NOTE — Discharge Instructions (Signed)
Your CT scan suggests a fracture of your right nasal bones. Please see Dr. Jenne PaneBates, or the ear nose and throat specialist of your choice for evaluation and management of this fracture. May use Tylenol every 4 hours, or the ibuprofen ordered 3 times daily for discomfort. Please return to the emergency department if any return of nasal bleeding. Nasal Fracture A nasal fracture is a break or crack in the bones of the nose. A minor break usually heals in a month. You often will receive black eyes from a nasal fracture. This is not a cause for concern. The black eyes will go away over 1 to 2 weeks.  DIAGNOSIS  Your caregiver may want to examine you if you are concerned about a fracture of the nose. X-rays of the nose may not show a nasal fracture even when one is present. Sometimes your caregiver must wait 1 to 5 days after the injury to re-check the nose for alignment and to take additional X-rays. Sometimes the caregiver must wait until the swelling has gone down. TREATMENT Minor fractures that have caused no deformity often do not require treatment. More serious fractures where bones are displaced may require surgery. This will take place after the swelling is gone. Surgery will stabilize and align the fracture. HOME CARE INSTRUCTIONS   Put ice on the injured area.  Put ice in a plastic bag.  Place a towel between your skin and the bag.  Leave the ice on for 15-20 minutes, 03-04 times a day.  Take medications as directed by your caregiver.  Only take over-the-counter or prescription medicines for pain, discomfort, or fever as directed by your caregiver.  If your nose starts bleeding, squeeze the soft parts of the nose against the center wall while you are sitting in an upright position for 10 minutes.  Contact sports should be avoided for at least 3 to 4 weeks or as directed by your caregiver. SEEK MEDICAL CARE IF:  Your pain increases or becomes severe.  You continue to have  nosebleeds.  The shape of your nose does not return to normal within 5 days.  You have pus draining from the nose. SEEK IMMEDIATE MEDICAL CARE IF:   You have bleeding from your nose that does not stop after 20 minutes of pinching the nostrils closed and keeping ice on the nose.  You have clear fluid draining from your nose.  You notice a grape-like swelling on the dividing wall between the nostrils (septum). This is a collection of blood (hematoma) that must be drained to help prevent infection.  You have difficulty moving your eyes.  You have recurrent vomiting. Document Released: 08/23/2000 Document Revised: 11/18/2011 Document Reviewed: 12/10/2010 [facility] Patient Information 2015 [place], [place]. This information is not intended to replace advice given to you by your health care provider. Make sure you discuss any questions you have with your health care provider.

## 2014-03-22 NOTE — ED Provider Notes (Signed)
CSN: [id number]     Arrival date & time 03/22/14  1217 History   First MD Initiated Contact with Patient 03/22/14 1335     Chief Complaint  Patient presents with  . Facial Injury     (Consider location/radiation/quality/duration/timing/severity/associated sxs/prior Treatment) HPI Comments: Pt is a 48 y/o female who was hit in the nose by her dog's head this AM. No LOC. Pt reports nose bleed that has resolved at this time. Pt denies any anticoagulation medications. No previous operations or procedures involving the nose or face.  Patient is a 48 y.o. female presenting with facial injury. The history is provided by the patient.  Facial Injury Mechanism of injury:  Direct blow Associated symptoms: no neck pain     Past Medical History  Diagnosis Date  . Thyroid disease     hypothyroid   Past Surgical History  Procedure Laterality Date  . Tonsillectomy     Family History  Problem Relation Age of Onset  . Diverticulitis Mother   . Hypertension Mother   . Cancer Father     multiple myeloma, lung  . Heart disease Father    History  Substance Use Topics  . Smoking status: Former Smoker    Quit date: 09/09/1988  . Smokeless tobacco: Not on file  . Alcohol Use: No   OB History   Grav Para Term Preterm Abortions TAB SAB Ect Mult Living                 Review of Systems  Constitutional: Negative for activity change.       All ROS Neg except as noted in HPI  Eyes: Negative for photophobia and discharge.  Respiratory: Negative for cough and shortness of breath.   Cardiovascular: Negative for chest pain and palpitations.  Gastrointestinal: Negative for abdominal pain and blood in stool.  Genitourinary: Negative for dysuria, frequency and hematuria.  Musculoskeletal: Negative for arthralgias, back pain and neck pain.  Skin: Negative.   Neurological: Negative for dizziness, seizures and speech difficulty.  Psychiatric/Behavioral: Negative for hallucinations and confusion.       Allergies  Review of patient's allergies indicates no known allergies.  Home Medications   Prior to Admission medications   Medication Sig Start Date End Date Taking? Authorizing Provider  ARMOUR THYROID 15 MG tablet Take 7.5 mg by mouth daily.  07/15/13   Historical Provider, MD  azithromycin (ZITHROMAX) 250 MG tablet Take 2 po first day and then one po qd x 4 days 09/07/13   Lysbeth Penner, FNP  escitalopram (LEXAPRO) 10 MG tablet TAKE 1 TABLET (10 MG TOTAL) BY MOUTH DAILY.    Lysbeth Penner, FNP  zolpidem (AMBIEN) 10 MG tablet Take 1 tablet (10 mg total) by mouth at bedtime as needed for sleep. 08/09/13 09/08/13  Mary-Margaret Hassell Done, FNP   BP 131/82  Pulse 71  Temp(Src) 97.9 F (36.6 C) (Oral)  Resp 18  Ht _0  (1.575 m)  Wt 145 lb (65.772 kg)  BMI 26.51 kg/m2  SpO2 99%  LMP 03/21/2014 Physical Exam  Nursing note and vitals reviewed. Constitutional: She is oriented to person, place, and time. She appears well-developed and well-nourished.  Non-toxic appearance.  HENT:  Head: Normocephalic.    Right Ear: Tympanic membrane and external ear normal.  Left Ear: Tympanic membrane and external ear normal.  Nose: Sinus tenderness present. No rhinorrhea, nose lacerations, nasal deformity, septal deviation or nasal septal hematoma.  No foreign bodies.  Eyes: EOM and lids are normal.  Pupils are equal, round, and reactive to light.  Neck: Normal range of motion. Neck supple. Carotid bruit is not present.  Cardiovascular: Normal rate, regular rhythm, normal heart sounds, intact distal pulses and normal pulses.   Pulmonary/Chest: Breath sounds normal. No respiratory distress.  Abdominal: Soft. Bowel sounds are normal. There is no tenderness. There is no guarding.  Musculoskeletal: Normal range of motion.  Lymphadenopathy:       Head (right side): No submandibular adenopathy present.       Head (left side): No submandibular adenopathy present.    She has no cervical  adenopathy.  Neurological: She is alert and oriented to person, place, and time. She has normal strength. No cranial nerve deficit or sensory deficit.  Skin: Skin is warm and dry.  Psychiatric: She has a normal mood and affect. Her speech is normal.    ED Course  Procedures (including critical care time) Labs Review Labs Reviewed - No data to display  Imaging Review No results found.   EKG Interpretation None      MDM Nasal bones x-ray was read as negative by the radiologist. The patient states that her nose is crooked, and not right. She request if anything else could be done or checked. CT maxillofacial bones has been ordered.  CT of the maxillofacial bones reveals a minimally depressed fracture of the right aspect of the nasal bone. No other fractures noted of the face.  The patient is referred to Dr. Redmond Baseman who was the nose and throat specialist on for today. Prescription for ibuprofen 800 mg of 1 tablet 3 times daily given to the patient at her request. The CT findings were presented to the patient. Questions were answered.    Final diagnoses:  None    **I have reviewed nursing notes, vital signs, and all appropriate lab and imaging results for this patient.Lenox Ahr, PA-C 03/22/14 [phone]

## 2014-03-22 NOTE — ED Notes (Signed)
Pt had her dog hit her nose this morning and pt states she heard a pop with incident and nose bleed but controlled at present

## 2014-03-23 NOTE — ED Provider Notes (Signed)
Medical screening examination/treatment/procedure(s) were performed by non-physician practitioner and as supervising physician I was immediately available for consultation/collaboration.   EKG Interpretation None        Amorita Vanrossum L Deshanae Lindo, MD 03/23/14 0713 

## 2014-04-29 ENCOUNTER — Other Ambulatory Visit: Payer: Self-pay | Admitting: Family Medicine

## 2014-05-02 NOTE — Telephone Encounter (Signed)
Patient NTBS for follow up and lab work  

## 2014-05-02 NOTE — Telephone Encounter (Signed)
Do not see on med list, but see where u ordered it on 08/09/13

## 2014-06-07 ENCOUNTER — Encounter: Payer: Self-pay | Admitting: Gastroenterology

## 2014-11-25 ENCOUNTER — Other Ambulatory Visit: Payer: Self-pay

## 2014-11-25 DIAGNOSIS — Z1231 Encounter for screening mammogram for malignant neoplasm of breast: Secondary | ICD-10-CM

## 2014-12-01 ENCOUNTER — Ambulatory Visit: Payer: Self-pay

## 2014-12-15 ENCOUNTER — Ambulatory Visit
Admission: RE | Admit: 2014-12-15 | Discharge: 2014-12-15 | Disposition: A | Discharge status: Home / Self Care | Payer: 59 | Source: Ambulatory Visit

## 2014-12-15 DIAGNOSIS — Z1231 Encounter for screening mammogram for malignant neoplasm of breast: Secondary | ICD-10-CM

## 2016-03-01 ENCOUNTER — Ambulatory Visit (INDEPENDENT_AMBULATORY_CARE_PROVIDER_SITE_OTHER): Payer: Self-pay | Admitting: Family

## 2016-03-01 ENCOUNTER — Encounter: Payer: Self-pay | Admitting: Family

## 2016-03-01 VITALS — BP 115/80 | HR 78 | Temp 98.0°F | Ht 62.0 in | Wt 147.8 lb

## 2016-03-01 DIAGNOSIS — F909 Attention-deficit hyperactivity disorder, unspecified type: Secondary | ICD-10-CM | POA: Insufficient documentation

## 2016-03-01 MED ORDER — AMPHETAMINE-DEXTROAMPHET ER 20 MG PO CP24: 20.0000 mg | ORAL_CAPSULE | Freq: Every day | ORAL | Status: DC

## 2016-03-01 NOTE — Progress Notes (Signed)
   Subjective:    Patient ID: Leah Torres, female    DOB: January 02, 1966, 50 y.o.   MRN: [id number]  HPI PT presents to the office today to discuss starting ADHD medication. [facility] and was [facility] for ADHD. Pt brought in paper work from clinic. Will scan in. Pt reports decreased concentration, difficulty completing tasks, and hyperactive.   Review of Systems  Constitutional: Negative.   HENT: Negative.   Eyes: Negative.   Respiratory: Negative.  Negative for shortness of breath.   Cardiovascular: Negative.  Negative for palpitations.  Gastrointestinal: Negative.   Endocrine: Negative.   Genitourinary: Negative.   Musculoskeletal: Negative.   Neurological: Negative.  Negative for headaches.  Hematological: Negative.   Psychiatric/Behavioral: Negative.   All other systems reviewed and are negative.      Objective:   Physical Exam  Constitutional: She is oriented to person, place, and time. She appears well-developed and well-nourished. No distress.  HENT:  Head: Normocephalic and atraumatic.  Right Ear: External ear normal.  Left Ear: External ear normal.  Nose: Nose normal.  Mouth/Throat: Oropharynx is clear and moist.  Eyes: Pupils are equal, round, and reactive to light.  Neck: Normal range of motion. Neck supple. No thyromegaly present.  Cardiovascular: Normal rate, regular rhythm, normal heart sounds and intact distal pulses.   No murmur heard. Pulmonary/Chest: Effort normal and breath sounds normal. No respiratory distress. She has no wheezes.  Abdominal: Soft. Bowel sounds are normal. She exhibits no distension. There is no tenderness.  Musculoskeletal: Normal range of motion. She exhibits no edema or tenderness.  Neurological: She is alert and oriented to person, place, and time.  Skin: Skin is warm and dry.  Psychiatric: She has a normal mood and affect. Her behavior is normal. Judgment and thought content normal.  Vitals reviewed.   BP  115/80 mmHg  Pulse 78  Temp(Src) 98 F (36.7 C) (Oral)  Ht 5\' 2"  (1.575 m)  Wt 147 lb 12.8 oz (67.042 kg)  BMI 27.03 kg/m2       Assessment & Plan:  1. Attention deficit hyperactivity disorder (ADHD), unspecified ADHD type -Pt started on Adderall XR 20 mg today -Meds as prescribed Behavior modification as needed Follow-up for recheck in 1 months - amphetamine-dextroamphetamine (ADDERALL XR) 20 MG 24 hr capsule; Take 1 capsule (20 mg total) by mouth daily.  Dispense: 30 capsule; Refill: 0  Jannifer Rodneyhristy Hawks, FNP

## 2016-03-01 NOTE — Patient Instructions (Signed)
Attention Deficit Hyperactivity Disorder  Attention deficit hyperactivity disorder (ADHD) is a problem with behavior issues based on the way the brain functions (neurobehavioral disorder). It is a common reason for behavior and academic problems in school.  SYMPTOMS   There are 3 types of ADHD. The 3 types and some of the symptoms include:  · Inattentive.    Gets bored or distracted easily.    Loses or forgets things. Forgets to hand in homework.    Has trouble organizing or completing tasks.    Difficulty staying on task.    An inability to organize daily tasks and school work.    Leaving projects, chores, or homework unfinished.    Trouble paying attention or responding to details. Careless mistakes.    Difficulty following directions. Often seems like is not listening.    Dislikes activities that require sustained attention (like chores or homework).  · Hyperactive-impulsive.    Feels like it is impossible to sit still or stay in a seat. Fidgeting with hands and feet.    Trouble waiting turn.    Talking too much or out of turn. Interruptive.    Speaks or acts impulsively.    Aggressive, disruptive behavior.    Constantly busy or on the go; noisy.    Often leaves seat when they are expected to remain seated.    Often runs or climbs where it is not appropriate, or feels very restless.  · Combined.    Has symptoms of both of the above.  Often children with ADHD feel discouraged about themselves and with school. They often perform well below their abilities in school.  As children get older, the excess motor activities can calm down, but the problems with paying attention and staying organized persist. Most children do not outgrow ADHD but with good treatment can learn to cope with the symptoms.  DIAGNOSIS   When ADHD is suspected, the diagnosis should be made by professionals trained in ADHD. This professional will collect information about the individual suspected of having ADHD. Information must be collected from  various settings where the person lives, works, or attends school.    Diagnosis will include:  · Confirming symptoms began in childhood.  · Ruling out other reasons for the child's behavior.  · The health care providers will check with the child's school and check their medical records.  · They will talk to teachers and parents.  · Behavior rating scales for the child will be filled out by those dealing with the child on a daily basis.  A diagnosis is made only after all information has been considered.  TREATMENT   Treatment usually includes behavioral treatment, tutoring or extra support in school, and stimulant medicines. Because of the way a person's brain works with ADHD, these medicines decrease impulsivity and hyperactivity and increase attention. This is different than how they would work in a person who does not have ADHD. Other medicines used include antidepressants and certain blood pressure medicines.  Most experts agree that treatment for ADHD should address all aspects of the person's functioning. Along with medicines, treatment should include structured classroom management at school. Parents should reward good behavior, provide constant discipline, and set limits. Tutoring should be available for the child as needed.  ADHD is a lifelong condition. If untreated, the disorder can have long-term serious effects into adolescence and adulthood.  HOME CARE INSTRUCTIONS   · Often with ADHD there is a lot of frustration among family members dealing with the condition. Blame   and anger are also feelings that are common. In many cases, because the problem affects the family as a whole, the entire family may need help. A therapist can help the family find better ways to handle the disruptive behaviors of the person with ADHD and promote change. If the person with ADHD is young, most of the therapist's work is with the parents. Parents will learn techniques for coping with and improving their child's behavior.  Sometimes only the child with the ADHD needs counseling. Your health care providers can help you make these decisions.  · Children with ADHD may need help learning how to organize. Some helpful tips include:  ¨ Keep routines the same every day from wake-up time to bedtime. Schedule all activities, including homework and playtime. Keep the schedule in a place where the person with ADHD will often see it. Mark schedule changes as far in advance as possible.  ¨ Schedule outdoor and indoor recreation.  ¨ Have a place for everything and keep everything in its place. This includes clothing, backpacks, and school supplies.  ¨ Encourage writing down assignments and bringing home needed books. Work with your child's teachers for assistance in organizing school work.  · Offer your child a well-balanced diet. Breakfast that includes a balance of whole grains, protein, and fruits or vegetables is especially important for school performance. Children should avoid drinks with caffeine including:  ¨ Soft drinks.  ¨ Coffee.  ¨ Tea.  ¨ However, some older children (adolescents) may find these drinks helpful in improving their attention. Because it can also be common for adolescents with ADHD to become addicted to caffeine, talk with your health care provider about what is a safe amount of caffeine intake for your child.  · Children with ADHD need consistent rules that they can understand and follow. If rules are followed, give small rewards. Children with ADHD often receive, and expect, criticism. Look for good behavior and praise it. Set realistic goals. Give clear instructions. Look for activities that can foster success and self-esteem. Make time for pleasant activities with your child. Give lots of affection.  · Parents are their children's greatest advocates. Learn as much as possible about ADHD. This helps you become a stronger and better advocate for your child. It also helps you educate your child's teachers and instructors  if they feel inadequate in these areas. Parent support groups are often helpful. A national group with local chapters is called Children and Adults with Attention Deficit Hyperactivity Disorder (CHADD).  SEEK MEDICAL CARE IF:  · Your child has repeated muscle twitches, cough, or speech outbursts.  · Your child has sleep problems.  · Your child has a marked loss of appetite.  · Your child develops depression.  · Your child has new or worsening behavioral problems.  · Your child develops dizziness.  · Your child has a racing heart.  · Your child has stomach pains.  · Your child develops headaches.  SEEK IMMEDIATE MEDICAL CARE IF:  · Your child has been diagnosed with depression or anxiety and the symptoms seem to be getting worse.  · Your child has been depressed and suddenly appears to have increased energy or motivation.  · You are worried that your child is having a bad reaction to a medication he or she is taking for ADHD.     This information is not intended to replace advice given to you by your health care provider. Make sure you discuss any questions you have with your   health care provider.     Document Released: 08/16/2002 Document Revised: 08/31/2013 Document Reviewed: 05/03/2013  Elsevier Interactive Patient Education ©2016 [employer]vier Inc.

## 2016-03-07 ENCOUNTER — Telehealth: Payer: Self-pay | Admitting: *Deleted

## 2016-03-07 MED ORDER — AMPHETAMINE-DEXTROAMPHETAMINE 20 MG PO TABS: 20.0000 mg | ORAL_TABLET | Freq: Two times a day (BID) | ORAL | Status: DC

## 2016-03-07 NOTE — Telephone Encounter (Signed)
RX ready for pick up 

## 2016-05-20 ENCOUNTER — Telehealth: Payer: Self-pay | Admitting: Gastroenterology

## 2016-05-20 NOTE — Telephone Encounter (Signed)
Pt describes anal pain with BR bleeding on TP.  She has a history of internal hemorrhoids.  The bleeding is not with every bowel movement but happen when she is constipated.  She states her rectal area is tender when she sits.  She is using a donut to sit on.  Pt was given first available appt 07/29/16 and advised to use miralax daily, increase fiber and water, and use prep H and she will call back if the bleeding worsens or if the above recommendations do not help.  Forwarded to Dr Christella HartiganJacobs for review and further recommendations.

## 2016-05-21 NOTE — Telephone Encounter (Signed)
I agree with the above plan, recs

## 2016-07-29 ENCOUNTER — Ambulatory Visit: Payer: Self-pay | Admitting: Gastroenterology

## 2017-04-11 ENCOUNTER — Encounter: Payer: Self-pay | Admitting: Pediatrics

## 2017-04-11 ENCOUNTER — Ambulatory Visit (INDEPENDENT_AMBULATORY_CARE_PROVIDER_SITE_OTHER): Payer: Self-pay | Admitting: Pediatrics

## 2017-04-11 VITALS — BP 131/88 | HR 79 | Temp 97.3°F | Ht 62.0 in | Wt 147.0 lb

## 2017-04-11 DIAGNOSIS — L0291 Cutaneous abscess, unspecified: Secondary | ICD-10-CM

## 2017-04-11 DIAGNOSIS — L259 Unspecified contact dermatitis, unspecified cause: Secondary | ICD-10-CM

## 2017-04-11 MED ORDER — PREDNISONE 20 MG PO TABS: ORAL_TABLET | ORAL | 0 refills | Status: DC

## 2017-04-11 MED ORDER — SULFAMETHOXAZOLE-TRIMETHOPRIM 800-160 MG PO TABS: 1.0000 | ORAL_TABLET | Freq: Two times a day (BID) | ORAL | 0 refills | Status: DC

## 2017-04-11 MED ORDER — TRIAMCINOLONE ACETONIDE 0.025 % EX OINT: 1.0000 "application " | TOPICAL_OINTMENT | Freq: Two times a day (BID) | CUTANEOUS | 1 refills | Status: DC

## 2017-04-11 NOTE — Progress Notes (Signed)
  Subjective:   Patient ID: Leah Torres, female    DOB: [birth year], 51 y.o.   MRN: [id number] CC: Recurrent Skin problems (left ear)  HPI: Leah FeelerLauren Lamarque is a 51 y.o. female presenting for Recurrent Skin problems (left ear)  Recurrent skin inflammation and rash over past year Had biopsy of rash on hands last summer, has contact dermatitis she thinks to gloves at work Works as RT at [employer]  Had rash on upper chest apprx 1 mo ago thought to be due to contact dermatitis from work Treated with steroids Had similar rash on hands, some redness between eyebrows, ears b/l have been very itchy Has tried blue nitrile gloves, yellow gloves at work, new Engineer, miningvinyl gloves at work, all seem to cause some irritation, gowns at work she also thinks she is reacting to Says possible could be hand sanitizer  Since starting to itch her ears a lot past couple of weeks she has had some draining from L ear past few days, very sore  Started feeling slightly better since draining No fevers Appetite has been fine  Relevant past medical, surgical, family and social history reviewed. Allergies and medications reviewed and updated. History  Smoking Status  . Former Smoker  . Quit date: 09/09/1988  Smokeless Tobacco  . Not on file   ROS: Per HPI   Objective:    BP 131/88   Pulse 79   Temp (!) 97.3 F (36.3 C) (Oral)   Ht 5\' 2"  (1.575 m)   Wt 147 lb (66.7 kg)   BMI 26.89 kg/m   Wt Readings from Last 3 Encounters:  04/11/17 147 lb (66.7 kg)  03/01/16 147 lb 12.8 oz (67 kg)  03/22/14 145 lb (65.8 kg)    Gen: NAD, alert, cooperative with exam, NCAT EYES: EOMI, no conjunctival injection, or no icterus ENT:  TMs dull gray b/l, L ear canal with narrowing, anterior bottom quadrant red with small apprx 1mm opening draining green-white fluid, with gentle palpation more return of fluid. OP without erythema LYMPH: no cervical LAD CV: NRRR, normal S1/S2, no murmur, distal pulses 2+ b/l Resp: CTABL, no wheezes,  normal WOB Ext: No edema, warm Neuro: Alert and oriented Skin: red patch between eye brows, slightly red medial upper R eye lid, lichenification b/l lateral dorsum of hands up to thumbs, some skin peeling fingertips throughout, slight redness over upper chest Ears b/l slightly red, peeling  Assessment & Plan:  [name] was seen today for abscess, rash.  Diagnoses and all orders for this visit:  Abscess Draining, in L ear canal Has been scratching ears a lot Warm compresses, start below -     sulfamethoxazole-trimethoprim (BACTRIM DS,SEPTRA DS) 800-160 MG tablet; Take 1 tablet by mouth 2 (two) times daily.  Contact dermatitis, unspecified contact dermatitis type, unspecified trigger Can try prednisone between eye brows, no more than OTC hydrocortisone on eye lid, only if itching Cont higher strength topical steroid previously prescribed on hands -     predniSONE (DELTASONE) 20 MG tablet; 2 po at same time daily for 3 days -     triamcinolone (KENALOG) 0.025 % ointment; Apply 1 application topically 2 (two) times daily.  discussed skin protection with above medicines  Follow up plan: Return if symptoms worsen or fail to improve. Rex Krasarol Paschal Blanton, MD [facility]

## 2017-04-14 ENCOUNTER — Telehealth: Payer: Self-pay | Admitting: Pediatrics

## 2017-04-14 MED ORDER — CEPHALEXIN 500 MG PO CAPS: 500.0000 mg | ORAL_CAPSULE | Freq: Three times a day (TID) | ORAL | 0 refills | Status: DC

## 2017-04-14 NOTE — Telephone Encounter (Signed)
Stop bactrim- sent in keflex rx to be taken 3x a day

## 2017-04-14 NOTE — Telephone Encounter (Signed)
Pt aware.

## 2017-04-18 ENCOUNTER — Telehealth: Payer: Self-pay | Admitting: Nurse Practitioner

## 2017-04-18 MED ORDER — PREDNISONE 10 MG (21) PO TBPK: ORAL_TABLET | ORAL | 0 refills | Status: DC

## 2017-04-18 NOTE — Telephone Encounter (Signed)
Please advise 

## 2017-04-18 NOTE — Addendum Note (Signed)
Addended by: Gwenith DailyHUDY, Lillien Petronio N on: 04/18/2017 06:22 PM   Modules accepted: Orders

## 2017-07-10 ENCOUNTER — Ambulatory Visit (INDEPENDENT_AMBULATORY_CARE_PROVIDER_SITE_OTHER): Payer: Self-pay | Admitting: Pediatrics

## 2017-07-10 ENCOUNTER — Encounter: Payer: Self-pay | Admitting: Pediatrics

## 2017-07-10 VITALS — BP 133/87 | HR 85 | Temp 97.7°F | Ht 62.0 in | Wt 149.6 lb

## 2017-07-10 DIAGNOSIS — F339 Major depressive disorder, recurrent, unspecified: Secondary | ICD-10-CM

## 2017-07-10 DIAGNOSIS — F909 Attention-deficit hyperactivity disorder, unspecified type: Secondary | ICD-10-CM

## 2017-07-10 DIAGNOSIS — F419 Anxiety disorder, unspecified: Secondary | ICD-10-CM

## 2017-07-10 MED ORDER — VENLAFAXINE HCL ER 37.5 MG PO CP24: 75.0000 mg | ORAL_CAPSULE | Freq: Every day | ORAL | 2 refills | Status: DC

## 2017-07-10 NOTE — Progress Notes (Signed)
Subjective:   Patient ID: Leah Torres, female    DOB: Sep 23, 1965, 51 y.o.   MRN: [id number] CC: Follow-up multiple med problems  HPI: Leah Torres is a 51 y.o. 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 Two other kids with ADHD Pt did a test with psychologist in the past for ADHD that showed ADHD, I do not have access to these records at this time Has a lot of social anxiety with work, and anxiety around work itself at times Doesn't want to leave the house some days  Has tried lexapro in the past, didn't like it, doesn't remember why Daughter on zoloft  Anxiety affecting life daily  Mood has been down with above No thoughts of self harm Open to counseling  No fevers, appetite is ok, otherwise feeling well   GAD 7 : Generalized Anxiety Score 07/10/2017  Nervous, Anxious, on Edge 3  Control/stop worrying 3  Worry too much - different things 3  Trouble relaxing 3  Restless 3  Easily annoyed or irritable 1  Afraid - awful might happen 3  Total GAD 7 Score 19  Anxiety Difficulty Somewhat difficult   [facility] 07/10/2017 04/11/2017 03/01/2016  Decreased Interest 3 0 0  Down, Depressed, Hopeless 3 0 0  PHQ - 2 Score 6 0 0  Altered sleeping 3 - -  Tired, decreased energy 3 - -  Change in appetite 0 - -  Feeling bad or failure about yourself  0 - -  Trouble concentrating 0 - -  Moving slowly or fidgety/restless 0 - -  Suicidal thoughts 0 - -  PHQ-9 Score 12 - -  Difficult doing work/chores Somewhat difficult - -      Relevant past medical, surgical, family and social history reviewed. Allergies and medications reviewed and updated. History  Smoking Status  . Former Smoker  . Quit date: 09/09/1988  Smokeless Tobacco  . Never Used   ROS: Per HPI   Objective:    BP 133/87   Pulse 85   Temp 97.7 F (36.5 C) (Oral)   Ht 5\' 2"  (1.575 m)   Wt 149 lb 9.6 oz (67.9 kg)   BMI 27.36 kg/m   Wt Readings from Last 3 Encounters:  07/10/17  149 lb 9.6 oz (67.9 kg)  04/11/17 147 lb (66.7 kg)  03/01/16 147 lb 12.8 oz (67 kg)    Gen: NAD, alert, cooperative with exam, NCAT EYES: EOMI, no conjunctival injection, or no icterus ENT:   OP without erythema LYMPH: no cervical LAD CV: NRRR, normal S1/S2, no murmur, distal pulses 2+ b/l Resp: CTABL, no wheezes, normal WOB Abd: +BS, soft, NTND.  Ext: No edema, warm Neuro: Alert and oriented Psych: normal affect, no thoughts of self harm  Assessment & Plan:  Leah Torres was seen today for follow-up multiple med problems  Diagnoses and all orders for this visit:  Depression, recurrent (HCC) Ongoing symptoms, start below -     venlafaxine XR (EFFEXOR-XR) 37.5 MG 24 hr capsule; Take 2 capsules (75 mg total) by mouth daily with breakfast.  Anxiety Ongoing symptoms, start below Discussed stress relief Encouraged counseling, gave counseling resources information -     venlafaxine XR (EFFEXOR-XR) 37.5 MG 24 hr capsule; Take 2 capsules (75 mg total) by mouth daily with breakfast.  Attention deficit hyperactivity disorder (ADHD), unspecified ADHD type Starting above, treating anxiety, rec cousneling  Follow up plan: Return in about 3 months (around 10/10/2017). Leah Krasarol Vincent, MD [facility]

## 2017-07-10 NOTE — Patient Instructions (Signed)
[email or web address]

## 2017-07-29 ENCOUNTER — Encounter: Payer: Self-pay | Admitting: Pediatrics

## 2017-07-29 DIAGNOSIS — L259 Unspecified contact dermatitis, unspecified cause: Secondary | ICD-10-CM

## 2017-07-29 DIAGNOSIS — F339 Major depressive disorder, recurrent, unspecified: Secondary | ICD-10-CM

## 2017-07-29 DIAGNOSIS — F419 Anxiety disorder, unspecified: Secondary | ICD-10-CM

## 2017-07-30 MED ORDER — VENLAFAXINE HCL ER 37.5 MG PO CP24: 112.5000 mg | ORAL_CAPSULE | Freq: Every day | ORAL | 2 refills | Status: DC

## 2017-08-01 MED ORDER — DESOXIMETASONE 0.25 % EX CREA: TOPICAL_CREAM | CUTANEOUS | 1 refills | Status: DC

## 2017-08-01 MED ORDER — TRIAMCINOLONE ACETONIDE 0.025 % EX OINT: 1.0000 "application " | TOPICAL_OINTMENT | Freq: Two times a day (BID) | CUTANEOUS | 1 refills | Status: DC

## 2017-08-01 MED ORDER — VENLAFAXINE HCL ER 37.5 MG PO CP24: 112.5000 mg | ORAL_CAPSULE | Freq: Every day | ORAL | 2 refills | Status: DC

## 2017-08-01 NOTE — Addendum Note (Signed)
Addended by: Caryl BisBOWMAN, Keishaun Hazel M on: 08/01/2017 08:23 AM   Modules accepted: Orders

## 2017-08-13 ENCOUNTER — Encounter: Payer: Self-pay | Admitting: Pediatrics

## 2017-09-13 ENCOUNTER — Encounter: Payer: Self-pay | Admitting: Family Medicine

## 2017-09-13 ENCOUNTER — Ambulatory Visit (INDEPENDENT_AMBULATORY_CARE_PROVIDER_SITE_OTHER): Payer: Self-pay | Admitting: Family Medicine

## 2017-09-13 VITALS — BP 146/98 | HR 98 | Temp 97.7°F | Ht 62.0 in | Wt 155.0 lb

## 2017-09-13 DIAGNOSIS — J069 Acute upper respiratory infection, unspecified: Secondary | ICD-10-CM

## 2017-09-13 DIAGNOSIS — B9789 Other viral agents as the cause of diseases classified elsewhere: Secondary | ICD-10-CM

## 2017-09-13 MED ORDER — METHYLPREDNISOLONE ACETATE 80 MG/ML IJ SUSP
80.0000 mg | Freq: Once | INTRAMUSCULAR | Status: AC
  Administered 2017-09-13: 80 mg via INTRAMUSCULAR

## 2017-09-13 MED ORDER — AZITHROMYCIN 250 MG PO TABS: ORAL_TABLET | ORAL | 0 refills | Status: AC

## 2017-09-13 MED ORDER — BENZONATATE 200 MG PO CAPS: 200.0000 mg | ORAL_CAPSULE | Freq: Two times a day (BID) | ORAL | 0 refills | Status: DC | PRN

## 2017-09-13 NOTE — Progress Notes (Signed)
Subjective: CC: concern for sinus infection PCP: Chevis Pretty, FNP MVH:QIONGE Leah Torres is a 52 y.o. female presenting to clinic today for:  1. Sinus symptoms  Patient reports dry cough, sore throat, headache, nausea that started 09/07/2017.  Denies hemoptysis, SOB, dizziness, rash,  vomiting, diarrhea, fevers, chills, myalgia, recent travel.  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  Patient has used Advil 600-800 mg, cough drops and over-the-counter cold and flu medication with good relief relief of symptoms except for cough.  Denies history of COPD or asthma.  Denies tobacco use/ exposure.  ROS: Per HPI  Allergies  Allergen Reactions  . Sulfa Antibiotics     Tongue swelling    Past Medical History:  Diagnosis Date  . Thyroid disease    hypothyroid    Current Outpatient Medications:  .  venlafaxine XR (EFFEXOR-XR) 37.5 MG 24 hr capsule, Take 3 capsules (112.5 mg total) by mouth daily with breakfast., Disp: 90 capsule, Rfl: 2 Social History   Socioeconomic History  . Marital status: Married    Spouse name: Not on file  . Number of children: Not on file  . Years of education: Not on file  . Highest education level: Not on file  Social Needs  . Financial resource strain: Not on file  . Food insecurity - worry: Not on file  . Food insecurity - inability: Not on file  . Transportation needs - medical: Not on file  . Transportation needs - non-medical: Not on file  Occupational History  . Not on file  Tobacco Use  . Smoking status: Former Smoker    Last attempt to quit: 09/09/1988    Years since quitting: 29.0  . Smokeless tobacco: Never Used  Substance and Sexual Activity  . Alcohol use: No  . Drug use: No  . Sexual activity: Not on file  Other Topics Concern  . Not on file  Social History Narrative  . Not on file   Family History  Problem Relation Age of Onset  . Diverticulitis Mother   . Hypertension Mother   . Cancer Father         multiple myeloma, lung  . Heart disease Father     Objective: Office vital signs reviewed. BP (!) 146/98   Pulse 98   Temp 97.7 F (36.5 C)   Ht _0  (1.575 m)   Wt 155 lb (70.3 kg)   SpO2 99%   BMI 28.35 kg/m   Physical Examination:  General: Awake, alert, well nourished, nontoxic appearing, No acute distress HEENT: Normal, MMM, sclera white, no ocular discharge Cardio: regular rate and rhythm, S1S2 heard, no murmurs appreciated Pulm: clear to auscultation bilaterally, no wheezes, rhonchi or rales; normal work of breathing on room air;  Coughing intermittently during exam.     Assessment/ Plan: 52 y.o. female   1. Viral URI with cough Patient is nontoxic-appearing, with normal vital signs including normal oxygen saturation on room air.  She is coughing quite a bit during the exam.  Her cardiopulmonary exam was unremarkable except for intermittent coughing spells.  This is likely a viral process.  I suspect that she is having bronchial irritation.  She was given a dose of steroid via IM injection during today's office visit.  I prescribed her Ladona Ridgel to take twice a day as needed for cough.  She may continue the over-the-counter therapies if needed.  She was given a written prescription for Z-Pak to use if symptoms do  not improve after the 2-week marker or she develops more significant symptoms.  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  Return precautions were reviewed with the patient.  She was good understanding will follow up as needed. - methylPREDNISolone acetate (DEPO-MEDROL) injection 80 mg   Meds ordered this encounter  Medications  . benzonatate (TESSALON) 200 MG capsule    Sig: Take 1 capsule (200 mg total) by mouth 2 (two) times daily as needed for cough.    Dispense:  20 capsule    Refill:  0  . methylPREDNISolone acetate (DEPO-MEDROL) injection 80 mg  . azithromycin (ZITHROMAX Z-PAK) 250 MG tablet    Sig:  As directed    Dispense:  6 tablet    Refill:  0    Pocket rx provided     Janora Norlander, DO [place] [phone]

## 2017-09-13 NOTE — Patient Instructions (Signed)
It appears that you have a viral upper respiratory infection (cold).  Cold symptoms can last up to 2 weeks.    - Get plenty of rest and drink plenty of fluids. - Try to breathe moist air. Use a cold mist humidifier. - Consume warm fluids (soup or tea) to provide relief for a stuffy nose and to loosen phlegm. - For nasal stuffiness, try saline nasal spray or a Neti Pot. Afrin nasal spray can also be used but this product should not be used longer than 3 days or it will cause rebound nasal stuffiness (worsening nasal congestion). - For sore throat pain relief: suck on throat lozenges, hard candy or popsicles; gargle with warm salt water (1/4 tsp. salt per 8 oz. of water); and eat soft, bland foods. - Eat a well-balanced diet. If you cannot, ensure you are getting enough nutrients by taking a daily multivitamin. - Avoid dairy products, as they can thicken phlegm. - Avoid alcohol, as it impairs your body's immune system.  CONTACT YOUR DOCTOR IF YOU EXPERIENCE ANY OF THE FOLLOWING: - High fever - Ear pain - Sinus-type headache - Unusually severe cold symptoms - Cough that gets worse while other cold symptoms improve - Flare up of any chronic lung problem, such as asthma - Your symptoms persist longer than 2 weeks    

## 2017-09-24 ENCOUNTER — Telehealth: Payer: Self-pay | Admitting: Nurse Practitioner

## 2017-09-24 NOTE — Telephone Encounter (Signed)
Done

## 2018-01-13 ENCOUNTER — Ambulatory Visit: Payer: Self-pay | Admitting: Family Medicine

## 2018-01-13 ENCOUNTER — Telehealth: Payer: Self-pay | Admitting: Nurse Practitioner

## 2018-01-13 NOTE — Telephone Encounter (Signed)
Pt has rash around neck and  Eyes Pt will call back to schedule appt

## 2018-01-13 NOTE — Telephone Encounter (Signed)
Pt is calling she had worked at [employer] over weekend and had allergic reaction to her stethscope and now her neck has rash and welps and eyes are itching and swelling her face is swelling some. She has started a new job and doesn't have any time to take and would like for [place] to call something in if we can to [facility]. If we do need to her to come in she can come in this evening after 6 PM tonight.

## 2018-01-13 NOTE — Telephone Encounter (Signed)
Patient is using steroid and benadryl cream she has at home, no swelling in throat or tongue.  Appointment schedule tonight at 6:15 pm.

## 2018-01-15 ENCOUNTER — Encounter: Payer: Self-pay | Admitting: Family Medicine

## 2018-01-15 ENCOUNTER — Ambulatory Visit (INDEPENDENT_AMBULATORY_CARE_PROVIDER_SITE_OTHER): Payer: Commercial Managed Care - HMO | Admitting: Family Medicine

## 2018-01-15 VITALS — BP 115/70 | HR 82 | Temp 97.7°F | Ht 62.0 in | Wt 153.0 lb

## 2018-01-15 DIAGNOSIS — L2389 Allergic contact dermatitis due to other agents: Secondary | ICD-10-CM | POA: Diagnosis not present

## 2018-01-15 MED ORDER — PREDNISONE 20 MG PO TABS: ORAL_TABLET | ORAL | 0 refills | Status: DC

## 2018-01-15 MED ORDER — METHYLPREDNISOLONE ACETATE 80 MG/ML IJ SUSP
80.0000 mg | Freq: Once | INTRAMUSCULAR | Status: AC
  Administered 2018-01-15: 80 mg via INTRAMUSCULAR

## 2018-01-15 NOTE — Progress Notes (Signed)
BP 115/70   Pulse 82   Temp 97.7 F (36.5 C) (Oral)   Ht  (1.575 m)   Wt 153 lb (69.4 kg)   BMI 27.98 kg/m    Subjective:    Patient ID: Leah Torres, female    DOB: 03-25-1966, 52 y.o.   MRN: [id number]  HPI: Leah Torres is a 52 y.o. female presenting on 01/15/2018 for Rash and swelling on face and neck (began after using/wearing her stethoscope )   HPI Swelling and pruritus Patient has swelling and pruritus around neck where she had a stethoscope around her neck at work.  She says she usually uses a stethoscope that is her own with a cough but this time she could not find hers and so she used a plastic one from work and she had a reaction around her neck in the area where the stethoscope was.  She denies any fevers or chills or pain just has swelling a lot of pruritus and now she started developed some allergic symptoms in her eyes and nose as well.   Relevant past medical, surgical, family and social history reviewed and updated as indicated. Interim medical history since our last visit reviewed. Allergies and medications reviewed and updated.  Review of Systems  Constitutional: Negative for chills and fever.  Eyes: Negative for visual disturbance.  Respiratory: Negative for chest tightness and shortness of breath.   Cardiovascular: Negative for chest pain and leg swelling.  Musculoskeletal: Negative for back pain and gait problem.  Skin: Positive for rash.  Neurological: Negative for light-headedness and headaches.  Psychiatric/Behavioral: Negative for agitation and behavioral problems.  All other systems reviewed and are negative.   Per HPI unless specifically indicated above   Allergies as of 01/15/2018      Reactions   Sulfa Antibiotics    Tongue swelling       Medication List        Accurate as of 01/15/18  8:58 AM. Always use your most recent med list.          predniSONE 20 MG tablet Commonly known as:  DELTASONE 2 po at same time daily for 5  days          Objective:    BP 115/70   Pulse 82   Temp 97.7 F (36.5 C) (Oral)   Ht  (1.575 m)   Wt 153 lb (69.4 kg)   BMI 27.98 kg/m   Wt Readings from Last 3 Encounters:  01/15/18 153 lb (69.4 kg)  09/13/17 155 lb (70.3 kg)  07/10/17 149 lb 9.6 oz (67.9 kg)    Physical Exam  Constitutional: She is oriented to person, place, and time. She appears well-developed and well-nourished. No distress.  Eyes: Conjunctivae are normal.  Cardiovascular: Normal rate, regular rhythm, normal heart sounds and intact distal pulses.  No murmur heard. Pulmonary/Chest: Effort normal and breath sounds normal. No respiratory distress. She has no wheezes.  Musculoskeletal: Normal range of motion. She exhibits no edema or tenderness.  Neurological: She is alert and oriented to person, place, and time. Coordination normal.  Skin: Skin is warm and dry. Rash noted. Rash is maculopapular (Maculopapular rash Around her neck where the stethoscope would have been, mild amount of swelling, no pain or redness or warmth). She is not diaphoretic.  Psychiatric: She has a normal mood and affect. Her behavior is normal.  Nursing note and vitals reviewed.       Assessment & Plan:   Problem List  Items Addressed This Visit    None    Visit Diagnoses    Allergic contact dermatitis due to other agents    -  Primary   Contact with plastic stethoscope around her neck, recommended for patient to start taking a daily antihistamine   Relevant Medications   methylPREDNISolone acetate (DEPO-MEDROL) injection 80 mg   predniSONE (DELTASONE) 20 MG tablet      Will give steroids but recommended a daily antihistamine  Follow up plan: Return if symptoms worsen or fail to improve.  Counseling provided for all of the vaccine components No orders of the defined types were placed in this encounter.   Arville Care, MD [facility] 01/15/2018, 8:58 AM

## 2018-05-07 ENCOUNTER — Other Ambulatory Visit: Payer: Self-pay | Admitting: Nurse Practitioner

## 2018-05-07 DIAGNOSIS — Z1231 Encounter for screening mammogram for malignant neoplasm of breast: Secondary | ICD-10-CM

## 2018-05-19 ENCOUNTER — Ambulatory Visit: Payer: 59

## 2018-05-20 ENCOUNTER — Encounter: Payer: Self-pay | Admitting: Radiology

## 2018-05-20 ENCOUNTER — Ambulatory Visit
Admission: RE | Admit: 2018-05-20 | Discharge: 2018-05-20 | Disposition: A | Discharge status: Home / Self Care | Payer: 59 | Source: Ambulatory Visit | Attending: Nurse Practitioner | Admitting: Nurse Practitioner

## 2018-05-20 ENCOUNTER — Other Ambulatory Visit: Payer: Self-pay | Admitting: Nurse Practitioner

## 2018-05-20 DIAGNOSIS — Z1231 Encounter for screening mammogram for malignant neoplasm of breast: Secondary | ICD-10-CM

## 2018-05-20 DIAGNOSIS — R5381 Other malaise: Secondary | ICD-10-CM

## 2018-05-21 ENCOUNTER — Other Ambulatory Visit: Payer: Self-pay | Admitting: Nurse Practitioner

## 2018-05-21 DIAGNOSIS — M810 Age-related osteoporosis without current pathological fracture: Secondary | ICD-10-CM

## 2018-10-07 ENCOUNTER — Encounter: Payer: Self-pay | Admitting: Nurse Practitioner

## 2018-10-07 ENCOUNTER — Ambulatory Visit (INDEPENDENT_AMBULATORY_CARE_PROVIDER_SITE_OTHER): Payer: Self-pay | Admitting: Nurse Practitioner

## 2018-10-07 VITALS — BP 118/87 | HR 92 | Temp 97.5°F | Ht 62.0 in | Wt 160.0 lb

## 2018-10-07 DIAGNOSIS — F411 Generalized anxiety disorder: Secondary | ICD-10-CM

## 2018-10-07 DIAGNOSIS — F909 Attention-deficit hyperactivity disorder, unspecified type: Secondary | ICD-10-CM

## 2018-10-07 MED ORDER — CITALOPRAM HYDROBROMIDE 40 MG PO TABS: 40.0000 mg | ORAL_TABLET | Freq: Every day | ORAL | 5 refills | Status: DC

## 2018-10-07 MED ORDER — DEXMETHYLPHENIDATE HCL ER 20 MG PO CP24: 20.0000 mg | ORAL_CAPSULE | Freq: Every day | ORAL | 0 refills | Status: DC

## 2018-10-07 NOTE — Addendum Note (Signed)
Addended by: Bennie Pierini on: 10/07/2018 02:45 PM   Modules accepted: Level of Service

## 2018-10-07 NOTE — Patient Instructions (Signed)

## 2018-10-07 NOTE — Progress Notes (Signed)
   Subjective:    Patient ID: Leah Torres, female    DOB: Oct 12, 1965, 53 y.o.   MRN: [id number]   Chief Complaint: ADHD   HPI Patient come sin today to discuss ADHD. She has seen Dr. Oswaldo Done in the past about this. Her last visit with her was 07/10/17. She says then she was diagnosed with ADHD but there was no access to that information.  At that time she was experiencing a lot of anxiety and depression and was started on effexor and was recommended to seek counseling.the effexor made her feel terrible.  She had been rx adderall by C. Hawks and that made her angry. She says that she cannot stay focused on anything. She is anxious all the time and her inability to focus makes her worst.  Depression screen [facility] 2/9 10/07/2018 01/15/2018 07/10/2017  Decreased Interest 2 0 3  Down, Depressed, Hopeless 2 1 3   PHQ - 2 Score 4 1 6   Altered sleeping 2 - 3  Tired, decreased energy 2 - 3  Change in appetite 2 - 0  Feeling bad or failure about yourself  2 - 0  Trouble concentrating 0 - 0  Moving slowly or fidgety/restless 0 - 0  Suicidal thoughts 0 - 0  PHQ-9 Score 12 - 12  Difficult doing work/chores - - Somewhat difficult      Review of Systems  Constitutional: Negative.   Respiratory: Negative.   Cardiovascular: Negative.   Gastrointestinal: Negative.   Neurological: Negative.   Psychiatric/Behavioral: Positive for dysphoric mood. The patient is not nervous/anxious.   All other systems reviewed and are negative.      Objective:   Physical Exam Constitutional:      General: She is not in acute distress.    Appearance: She is normal weight.  Neck:     Musculoskeletal: Normal range of motion.  Cardiovascular:     Rate and Rhythm: Normal rate and regular rhythm.     Pulses: Normal pulses.     Heart sounds: Normal heart sounds.  Skin:    General: Skin is warm and dry.  Neurological:     General: No focal deficit present.     Mental Status: She is alert and oriented to person,  place, and time.  Psychiatric:        Mood and Affect: Mood normal.        Behavior: Behavior normal.     BP 118/87   Pulse 92   Temp (!) 97.5 F (36.4 C) (Oral)   Ht 5\' 2"  (1.575 m)   Wt 160 lb (72.6 kg)   BMI 29.26 kg/m        Assessment & Plan:  Leah Torres in today with chief complaint of ADHD   1. Attention deficit hyperactivity disorder (ADHD), unspecified ADHD type - dexmethylphenidate (FOCALIN XR) 20 MG 24 hr capsule; Take 1 capsule (20 mg total) by mouth daily for 30 days.  Dispense: 30 capsule; Refill: 0  2. GAD (generalized anxiety disorder) Stress management discussed side effects of meds Will start on 1/2 tablet for 3-4 days then bump up to whole tablet Follow up in 3 weeks - citalopram (CELEXA) 40 MG tablet; Take 1 tablet (40 mg total) by mouth daily.  Dispense: 30 tablet; Refill: 5  Mary-Margaret Daphine Deutscher, FNP

## 2018-10-14 ENCOUNTER — Telehealth: Payer: Self-pay | Admitting: Nurse Practitioner

## 2018-10-14 DIAGNOSIS — F909 Attention-deficit hyperactivity disorder, unspecified type: Secondary | ICD-10-CM

## 2018-10-15 MED ORDER — DEXMETHYLPHENIDATE HCL ER 20 MG PO CP24: 20.0000 mg | ORAL_CAPSULE | Freq: Every day | ORAL | 0 refills | Status: DC

## 2018-10-15 NOTE — Telephone Encounter (Signed)
Ok to come pick up rx for focalin

## 2018-10-15 NOTE — Telephone Encounter (Signed)
Patient aware.

## 2018-10-27 ENCOUNTER — Ambulatory Visit: Payer: Self-pay | Admitting: Nurse Practitioner

## 2018-12-03 ENCOUNTER — Telehealth: Payer: Self-pay | Admitting: Nurse Practitioner

## 2018-12-03 NOTE — Telephone Encounter (Signed)
Patient states that she started Focalin on 3/20 and was told to follow up in 3 weeks for medication check.  Advised patient to call back in 2 1/2 weeks to see what this office is doing at that time due to pandemic.  Patient verbalized understanding.

## 2019-04-01 ENCOUNTER — Other Ambulatory Visit: Payer: Self-pay | Admitting: Family

## 2019-04-08 ENCOUNTER — Ambulatory Visit (INDEPENDENT_AMBULATORY_CARE_PROVIDER_SITE_OTHER): Payer: Self-pay | Admitting: Nurse Practitioner

## 2019-04-08 ENCOUNTER — Encounter: Payer: Self-pay | Admitting: Nurse Practitioner

## 2019-04-08 DIAGNOSIS — F411 Generalized anxiety disorder: Secondary | ICD-10-CM

## 2019-04-08 DIAGNOSIS — F909 Attention-deficit hyperactivity disorder, unspecified type: Secondary | ICD-10-CM

## 2019-04-08 DIAGNOSIS — F5101 Primary insomnia: Secondary | ICD-10-CM

## 2019-04-08 MED ORDER — CITALOPRAM HYDROBROMIDE 40 MG PO TABS: 40.0000 mg | ORAL_TABLET | Freq: Every day | ORAL | 5 refills | Status: DC

## 2019-04-08 NOTE — Progress Notes (Signed)
Virtual Visit via telephone Note Due to COVID-19 pandemic this visit was conducted virtually. This visit type was conducted due to national recommendations for restrictions regarding the COVID-19 Pandemic (e.g. social distancing, sheltering in place) in an effort to limit this patient's exposure and mitigate transmission in our community. All issues noted in this document were discussed and addressed.  A physical exam was not performed with this format.  I connected with Leah Torres on 04/08/19 at 8:00 AM by telephone and verified that I am speaking with the correct person using two identifiers. Leah Torres is currently located at home and no one is currently with her during visit. The provider, Mary-Margaret Hassell Done, FNP is located in their office at time of visit.  I discussed the limitations, risks, security and privacy concerns of performing an evaluation and management service by telephone and the availability of in person appointments. I also discussed with the patient that there may be a patient responsible charge related to this service. The patient expressed understanding and agreed to proceed.   History and Present Illness:   Chief Complaint: Medical Management of Chronic Issues    HPI:  1. Primary insomnia Sleeps well most nights and she says taking celexa at night which seems to help.  2. GAD (generalized anxiety disorder) Is on celexa and is doing well keeps her calm. She says tit has really helped her social anxiety.  3. Attention deficit hyperactivity disorder (ADHD), unspecified ADHD type Tried focalin XR '20mg'$  but hated the way it made her feel so she decided she would just figure it out without it.    Outpatient Encounter Medications as of 04/08/2019  Medication Sig  . citalopram (CELEXA) 40 MG tablet Take 1 tablet (40 mg total) by mouth daily.  Marland Kitchen dexmethylphenidate (FOCALIN XR) 20 MG 24 hr capsule Take 1 capsule (20 mg total) by mouth daily for 30 days.      Past Surgical History:  Procedure Laterality Date  . BREAST EXCISIONAL BIOPSY Right   . TONSILLECTOMY      Family History  Problem Relation Age of Onset  . Diverticulitis Mother   . Hypertension Mother   . Cancer Father        multiple myeloma, lung  . Heart disease Father     New complaints: None today  Social history: Works 10 hour days from home.  Controlled substance contract: N/A    Review of Systems  Constitutional: Negative for diaphoresis and weight loss.  Eyes: Negative for blurred vision, double vision and pain.  Respiratory: Negative for shortness of breath.   Cardiovascular: Negative for chest pain, palpitations, orthopnea and leg swelling.  Gastrointestinal: Negative for abdominal pain.  Skin: Negative for rash.  Neurological: Negative for dizziness, sensory change, loss of consciousness, weakness and headaches.  Endo/Heme/Allergies: Negative for polydipsia. Does not bruise/bleed easily.  Psychiatric/Behavioral: Negative for memory loss. The patient does not have insomnia.   All other systems reviewed and are negative.    Observations/Objective: Alert and oriented  Answers all questions appropriately No distress  Assessment and Plan: Leah Torres in today with chief complaint of Medical Management of Chronic Issues   1. Primary insomnia Will try meltonin OTC Bedtime routine  2. GAD (generalized anxiety disorder) Stress management - citalopram (CELEXA) 40 MG tablet; Take 1 tablet (40 mg total) by mouth daily.  Dispense: 30 tablet; Refill: 5  3. Attention deficit hyperactivity disorder (ADHD), unspecified ADHD type Does not want to be on anything for this  right n ow.   Follow Up Instructions: 6 months    I discussed the assessment and treatment plan with the patient. The patient was provided an opportunity to ask questions and all were answered. The patient agreed with the plan and demonstrated an understanding of the instructions.    The patient was advised to call back or seek an in-person evaluation if the symptoms worsen or if the condition fails to improve as anticipated.  The above assessment and management plan was discussed with the patient. The patient verbalized understanding of and has agreed to the management plan. Patient is aware to call the clinic if symptoms persist or worsen. Patient is aware when to return to the clinic for a follow-up visit. Patient educated on when it is appropriate to go to the emergency department.   Time call ended:  8:15  I provided 15 minutes of non-face-to-face time during this encounter.    Mary-Margaret Hassell Done, FNP

## 2019-06-17 ENCOUNTER — Other Ambulatory Visit: Payer: Self-pay

## 2019-06-17 DIAGNOSIS — Z20822 Contact with and (suspected) exposure to covid-19: Secondary | ICD-10-CM

## 2019-06-18 LAB — NOVEL CORONAVIRUS, NAA: SARS-CoV-2, NAA: NOT DETECTED

## 2019-09-02 ENCOUNTER — Other Ambulatory Visit: Payer: Self-pay

## 2019-09-02 ENCOUNTER — Other Ambulatory Visit: Payer: 59

## 2019-09-02 ENCOUNTER — Ambulatory Visit: Payer: 59 | Attending: Internal Medicine

## 2019-09-02 DIAGNOSIS — Z20822 Contact with and (suspected) exposure to covid-19: Secondary | ICD-10-CM

## 2019-09-03 LAB — NOVEL CORONAVIRUS, NAA: SARS-CoV-2, NAA: NOT DETECTED

## 2019-10-06 ENCOUNTER — Other Ambulatory Visit: Payer: Self-pay | Admitting: Nurse Practitioner

## 2019-10-06 DIAGNOSIS — F411 Generalized anxiety disorder: Secondary | ICD-10-CM

## 2019-12-02 ENCOUNTER — Other Ambulatory Visit: Payer: Self-pay | Admitting: Nurse Practitioner

## 2019-12-02 DIAGNOSIS — F411 Generalized anxiety disorder: Secondary | ICD-10-CM

## 2019-12-02 NOTE — Telephone Encounter (Signed)
Left message- call to schedule an appointment for medication refills.

## 2021-02-16 ENCOUNTER — Ambulatory Visit: Payer: Self-pay | Admitting: Nurse Practitioner

## 2021-02-19 ENCOUNTER — Encounter: Payer: Self-pay | Admitting: Nurse Practitioner

## 2021-02-19 ENCOUNTER — Ambulatory Visit (INDEPENDENT_AMBULATORY_CARE_PROVIDER_SITE_OTHER): Payer: Self-pay | Admitting: Nurse Practitioner

## 2021-02-19 DIAGNOSIS — J0110 Acute frontal sinusitis, unspecified: Secondary | ICD-10-CM

## 2021-02-19 MED ORDER — AZITHROMYCIN 250 MG PO TABS: ORAL_TABLET | ORAL | 0 refills | Status: AC

## 2021-02-19 MED ORDER — PREDNISONE 10 MG (21) PO TBPK: ORAL_TABLET | ORAL | 0 refills | Status: DC

## 2021-02-19 MED ORDER — BENZONATATE 100 MG PO CAPS: 100.0000 mg | ORAL_CAPSULE | Freq: Three times a day (TID) | ORAL | 0 refills | Status: DC | PRN

## 2021-02-19 NOTE — Progress Notes (Signed)
   Virtual Visit  Note Due to COVID-19 pandemic this visit was conducted virtually. This visit type was conducted due to national recommendations for restrictions regarding the COVID-19 Pandemic (e.g. social distancing, sheltering in place) in an effort to limit this patient's exposure and mitigate transmission in our community. All issues noted in this document were discussed and addressed.  A physical exam was not performed with this format.  I connected with Leah Torres on 02/19/21 at 8:30 am by telephone and verified that I am speaking with the correct person using two identifiers. Leah Torres is currently located at home during visit. The provider, Daryll Drown, NP is located in their office at time of visit.  I discussed the limitations, risks, security and privacy concerns of performing an evaluation and management service by telephone and the availability of in person appointments. I also discussed with the patient that there may be a patient responsible charge related to this service. The patient expressed understanding and agreed to proceed.   History and Present Illness:  Sinusitis This is a recurrent problem. The current episode started in the past 7 days. The problem has been gradually worsening since onset. There has been no fever. The pain is moderate. Associated symptoms include congestion, coughing, sinus pressure, a sore throat and swollen glands. Pertinent negatives include no chills. Past treatments include nothing.     Review of Systems  Constitutional: Negative.  Negative for chills, fever and malaise/fatigue.  HENT:  Positive for congestion, sinus pressure, sinus pain and sore throat.   Respiratory:  Positive for cough.   Skin:  Negative for rash.  All other systems reviewed and are negative.   Observations/Objective: Tele -visit patient did not sound to be in distress  Assessment and Plan:  Worsening symptoms of sinusitis with sore throat, sinus  pressure, headache, cough, swollen lymph nodes and sinus drainage. Take medication as prescribed, increase hydration, Z-Pak 500 mg tablet day 1, 250 mg tablet day 2-5.  Benzonatate for cough, Tylenol for headache and pain, warm salt water gargle, Chloraseptic spray, Rx sent to pharmacy.  Follow Up Instructions:  Follow-up with worsening unresolved symptoms.    I discussed the assessment and treatment plan with the patient. The patient was provided an opportunity to ask questions and all were answered. The patient agreed with the plan and demonstrated an understanding of the instructions.   The patient was advised to call back or seek an in-person evaluation if the symptoms worsen or if the condition fails to improve as anticipated.  The above assessment and management plan was discussed with the patient. The patient verbalized understanding of and has agreed to the management plan. Patient is aware to call the clinic if symptoms persist or worsen. Patient is aware when to return to the clinic for a follow-up visit. Patient educated on when it is appropriate to go to the emergency department.   Time call ended: 8:42 AM   I provided 12 minutes of  non face-to-face time during this encounter.    Daryll Drown, NP

## 2021-02-19 NOTE — Assessment & Plan Note (Signed)
Worsening symptoms of sinusitis with sore throat, sinus pressure, headache, cough, swollen lymph nodes and sinus drainage. Take medication as prescribed, increase hydration, Z-Pak 500 mg tablet day 1, 250 mg tablet day 2-5.  Benzonatate for cough, Tylenol for headache and pain, warm salt water gargle, Chloraseptic spray, Rx sent to pharmacy.

## 2022-05-29 ENCOUNTER — Encounter: Payer: Self-pay | Admitting: Family Medicine

## 2022-05-29 ENCOUNTER — Telehealth (INDEPENDENT_AMBULATORY_CARE_PROVIDER_SITE_OTHER): Payer: Self-pay | Admitting: Family Medicine

## 2022-05-29 DIAGNOSIS — J019 Acute sinusitis, unspecified: Secondary | ICD-10-CM

## 2022-05-29 MED ORDER — PREDNISONE 10 MG (21) PO TBPK: ORAL_TABLET | ORAL | 0 refills | Status: AC

## 2022-05-29 MED ORDER — AZITHROMYCIN 250 MG PO TABS: ORAL_TABLET | ORAL | 0 refills | Status: AC

## 2022-05-29 NOTE — Progress Notes (Signed)
   Virtual Visit via Telephone Note  I connected with Leah Torres on 05/29/22 at 9:00 AM by telephone and verified that I am speaking with the correct person using two identifiers. Leah Torres is currently located at home and nobody is currently with her during this visit. The provider, Loman Brooklyn, FNP is located in their office at time of visit.  I discussed the limitations, risks, security and privacy concerns of performing an evaluation and management service by telephone and the availability of in person appointments. I also discussed with the patient that there may be a patient responsible charge related to this service. The patient expressed understanding and agreed to proceed.  Subjective: PCP: Chevis Pretty, FNP  Chief Complaint  Patient presents with   URI   Patient complains of cough, head congestion, facial pain/pressure, and postnasal drainage. Onset of symptoms was 3 days ago, unchanged since that time. She is drinking plenty of fluids. Evaluation to date: none. Treatment to date: cough suppressants and decongestants. She does not smoke.    ROS: Per HPI  Current Outpatient Medications:    citalopram (CELEXA) 40 MG tablet, Take 1 tablet (40 mg total) by mouth daily. (Needs to be seen before next refill), Disp: 30 tablet, Rfl: 0  Allergies  Allergen Reactions   Sulfa Antibiotics     Tongue swelling    Past Medical History:  Diagnosis Date   Thyroid disease    hypothyroid    Observations/Objective: A&O  No respiratory distress or wheezing audible over the phone Mood, judgement, and thought processes all WNL  Assessment and Plan: 1. Acute non-recurrent sinusitis, unspecified location Discussed COVID cannot be ruled out without testing. She declines coming to the office, but will take a home test later. Continue symptom management. - azithromycin (ZITHROMAX Z-PAK) 250 MG tablet; Take 2 tablets (500 mg) PO today, then 1 tablet (250 mg) PO daily  x4 days.  Dispense: 6 tablet; Refill: 0 - predniSONE (STERAPRED UNI-PAK 21 TAB) 10 MG (21) TBPK tablet; As directed x 6 days  Dispense: 21 tablet; Refill: 0   Follow Up Instructions:  I discussed the assessment and treatment plan with the patient. The patient was provided an opportunity to ask questions and all were answered. The patient agreed with the plan and demonstrated an understanding of the instructions.   The patient was advised to call back or seek an in-person evaluation if the symptoms worsen or if the condition fails to improve as anticipated.  The above assessment and management plan was discussed with the patient. The patient verbalized understanding of and has agreed to the management plan. Patient is aware to call the clinic if symptoms persist or worsen. Patient is aware when to return to the clinic for a follow-up visit. Patient educated on when it is appropriate to go to the emergency department.   Time call ended: 9:11 AM  I provided 11 minutes of non-face-to-face time during this encounter.  Hendricks Limes, MSN, APRN, FNP-C [facility] 05/29/22

## 2022-11-05 ENCOUNTER — Encounter: Payer: Self-pay | Admitting: Nurse Practitioner
